# Patient Record
Sex: Male | Born: 1950 | Race: White | Hispanic: Yes | Smoking: Current every day smoker
Health system: Southern US, Community
[De-identification: ages and names within clinical notes are randomized; demographics above are authoritative.]

## PROBLEM LIST (undated history)

## (undated) DIAGNOSIS — K219 Gastro-esophageal reflux disease without esophagitis: Secondary | ICD-10-CM

---

## 2014-02-01 ENCOUNTER — Ambulatory Visit (HOSPITAL_BASED_OUTPATIENT_CLINIC_OR_DEPARTMENT_OTHER)
Admission: RE | Admit: 2014-02-01 | Discharge: 2014-02-01 | Disposition: A | Discharge status: Home / Self Care | Payer: Managed Care, Other (non HMO) | Source: Ambulatory Visit | Attending: Medical | Admitting: Medical

## 2014-02-01 ENCOUNTER — Ambulatory Visit (INDEPENDENT_AMBULATORY_CARE_PROVIDER_SITE_OTHER): Payer: Managed Care, Other (non HMO) | Admitting: Medical

## 2014-02-01 ENCOUNTER — Encounter: Payer: Self-pay | Admitting: Medical

## 2014-02-01 VITALS — BP 138/84 | HR 71 | Temp 98.9°F | Ht 66.75 in | Wt 185.2 lb

## 2014-02-01 DIAGNOSIS — N5089 Other specified disorders of the male genital organs: Secondary | ICD-10-CM | POA: Diagnosis present

## 2014-02-01 DIAGNOSIS — N433 Hydrocele, unspecified: Secondary | ICD-10-CM | POA: Insufficient documentation

## 2014-02-01 NOTE — Progress Notes (Signed)
Subjective:    Patient ID: Stephen Vincent, male    DOB: 1950-08-02, 63 y.o.   MRN: [id number]  HPI   Pt is a new pt. Pt states has not been to medical provider for 20 yrs or more. He had maybe basic brief physical exam before employment. Pt is from [place]. Pt new to the area for 8 months. Mom had bone cancer 76 yo deceased. Dad deceased accident. Sister- allergic rhinitis.  See pmh, psh, and fh.   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 Pt widow for 8 yrs. He has new girlfriend. 1 child 85 yrs old. Drinks 1-3 cups coffee a day. Occasional Coca-Cola. Pt walks occasionally. Pt does not have a strict diet. He eats twice a day.  Pt does state bulge in left inguinal canal for months. Gradually increased and now scrotum region. Does not hurt. Occasionally when he walks a lot gets swollen. Previously job he was lifting some but no exact cause that he remembers. Inguinal and scrotal bulge gradually enlarging over months. He estimates 2-3.  Pt does not have medicare. He has his private insurance. No   History of psa checked. No history of colonsocpy.     Review of Systems  Constitutional: Positive for fatigue. Negative for fever and chills.       In his new job he gets tired some. When walking.   HENT: Negative for congestion, ear discharge, ear pain, nosebleeds, postnasal drip, sore throat and tinnitus.   Eyes: Negative for photophobia, pain, discharge, redness, itching and visual disturbance.       Wears glasses. Last seen optometrist in September.  Respiratory: Negative for cough, choking, chest tightness, shortness of breath and wheezing.   Cardiovascular: Negative for chest pain and palpitations.  Gastrointestinal: Negative for nausea, vomiting, abdominal pain, diarrhea, constipation, blood in stool, abdominal distention, anal bleeding and rectal pain.  Endocrine: Negative for polydipsia, polyphagia and  polyuria.  Genitourinary: Negative for dysuria, urgency, frequency, hematuria, flank pain, discharge, penile swelling, scrotal swelling, enuresis, difficulty urinating, genital sores, penile pain and testicular pain.       Lt groin lump that has grown and not present in his scrotum  Musculoskeletal: Negative for arthralgias, back pain, gait problem, joint swelling, myalgias and neck stiffness.       Lt 2nd digit. Mild thick dip joint. But no pain.  Skin: Negative for color change, pallor, rash and wound.  Allergic/Immunologic: Negative for environmental allergies, food allergies and immunocompromised state.  Neurological: Negative for dizziness, tremors, seizures, syncope, facial asymmetry, speech difficulty, weakness, light-headedness, numbness and headaches.  Hematological: Negative for adenopathy. Does not bruise/bleed easily.  Psychiatric/Behavioral: Negative for behavioral problems, confusion and agitation.       When his wife passed 8 yrs ago. He had chronic low grade depression. He decided to work all the time to deal with depression. He had no family close. He has 2 cats and one dog. This helps. He still feels mild depression at times. Now that he has a girfriend he feels better not depressed.       Objective:   Physical Exam  Constitutional: He is oriented to person, place, and time. He appears well-developed and well-nourished. No distress.  HENT:  Head: Normocephalic and atraumatic.  Right Ear: External ear normal.  Left Ear: External ear normal.  Mouth/Throat: No oropharyngeal exudate.  Eyes: Conjunctivae are normal.  Neck: Neck supple. No JVD present. No  tracheal deviation present. No thyromegaly present.  Cardiovascular: Normal rate, regular rhythm and normal heart sounds.  Exam reveals no friction rub.   No murmur heard. Pulmonary/Chest: Effort normal and breath sounds normal. No stridor. No respiratory distress. He has no wheezes. He has no rales. He exhibits no tenderness.    Abdominal: Soft. Bowel sounds are normal. He exhibits no distension and no mass. There is no tenderness. There is no rebound and no guarding.  Genitourinary: Penis normal.  Patient has a large swollen region extending from the left lower inguinal canal all the way to the left side of his scrotum. This area feels full and tight like a hydrocele. I cannot determine if he has a hernia. On exam he does not have any pain.  Lymphadenopathy:    He has no cervical adenopathy.  Neurological: He is alert and oriented to person, place, and time. No cranial nerve deficit.  Skin: Skin is warm. No rash noted. He is not diaphoretic. No erythema. No pallor.     Otherwise normal. Large swollen area. Left inguinal region and on left side of scrotum.(Appointment prefers 11-12  Appointment.          Assessment & Plan:

## 2014-02-01 NOTE — Patient Instructions (Signed)
For your scrotal swelling and swollen area in inguinal region, I want to order a scrotal ultrasound to asses if your have hydrocele or hernia.(Possibly may have both.) I am going to refer you to urologist as well. Since you report some mild depression for years and you smoke, I want to offer wellbutrin. This medication may help your mood and may help you quit smoking. We will call you on your ultraound report. If you want to try wellbutrin let me know and will call this medication to your pharmacy. Also please schedule a appointment with me in 2-3 weeks for complete physical.   If you have lower abdomen pain or scotum pain pending urology appointment then be seen in ED.

## 2014-02-01 NOTE — Assessment & Plan Note (Signed)
This has occurred over the last 2-3 months or more. It is unclear if he has a hydrocele with possible hernia. I'm getting a ultrasound today to determine exactly etiology of the swelling. Since this is quite large I am going to go ahead and refer to urology. Patient was instructed that pending urology evaluation if he gets any acute pain or any associated signs and symptoms then he should be seen at the emergency department. Patient expresses understanding.

## 2014-02-03 ENCOUNTER — Telehealth: Payer: Self-pay | Admitting: Medical

## 2014-02-03 DIAGNOSIS — R1909 Other intra-abdominal and pelvic swelling, mass and lump: Secondary | ICD-10-CM

## 2014-02-03 NOTE — Telephone Encounter (Signed)
I did call pt today and left message on his machine stating that [place] was not conclusive and that radiologist could not give exact opinion on region. Explained radiologist recommend getting a CT. So I will place order and I explained to pt we will call him and advise him on the appointment date.

## 2014-02-05 ENCOUNTER — Telehealth: Payer: Self-pay | Admitting: Medical

## 2014-02-05 DIAGNOSIS — R1909 Other intra-abdominal and pelvic swelling, mass and lump: Secondary | ICD-10-CM

## 2014-02-05 NOTE — Telephone Encounter (Signed)
Stephen Vincent. Did I place order with contrast. Report just states Ct. Would you ask radiology do they want it done with or without. When I ordered I thought ordered without. I checked with contrast by accident if I ordered that.I will put in cmp if they think contrast needed. Please let me know.

## 2014-02-06 ENCOUNTER — Telehealth: Payer: Self-pay | Admitting: Medical

## 2014-02-06 NOTE — Telephone Encounter (Signed)
With and without per Radiology

## 2014-02-06 NOTE — Telephone Encounter (Signed)
Order ct and cmp done. Will notify  Anderson Malta.

## 2014-02-06 NOTE — Telephone Encounter (Signed)
Will place pelvic ct with and without contrast. And order cmp. (for left inguinal mass)

## 2014-02-08 ENCOUNTER — Telehealth: Payer: Self-pay | Admitting: Medical

## 2014-02-08 NOTE — Telephone Encounter (Signed)
Called to do peer to peer review for ct pelvis authorization. Called twice. All circuits busy. Will try later.

## 2014-02-12 ENCOUNTER — Telehealth: Payer: Self-pay | Admitting: Medical

## 2014-02-12 NOTE — Telephone Encounter (Signed)
I called radiologist who read his ultrasound. He will not be available until 3 pm. So I will call back later.

## 2014-02-12 NOTE — Telephone Encounter (Signed)
I did talk with Dr. Derrel Nip about the ultrasound. And he states that ct with oral contrast would be best to give definitive diagnosis.  So I will pass message for Anderson Malta to contact insurance. I have tried there number various times and was unable to get through. Per there sheet complication of hernia would be criteria that would allow me to order CT or if radiologist recommended. I talked with Dr. Derrel Nip on 02-12-2014 at 8:20.

## 2014-02-13 ENCOUNTER — Telehealth: Payer: Self-pay | Admitting: Medical

## 2014-02-13 DIAGNOSIS — Z87891 Personal history of nicotine dependence: Secondary | ICD-10-CM

## 2014-02-13 DIAGNOSIS — R1909 Other intra-abdominal and pelvic swelling, mass and lump: Secondary | ICD-10-CM

## 2014-02-13 NOTE — Telephone Encounter (Signed)
Caller name: Geovany  Relation to pt: self  Call back number: [phone]  Reason for call:   inquring about urology referral

## 2014-02-13 NOTE — Telephone Encounter (Signed)
Did you call insurance? Insurance needs a peer to peer review. [phone] hit option 4, then 2. Case ID is [id number]

## 2014-02-13 NOTE — Telephone Encounter (Signed)
I have called them numerous times. Never able to get through. Would you get them on the phone and I get me. Then I will talk with insurance personal. Explain to them I had conversation with radiologist.

## 2014-02-13 NOTE — Telephone Encounter (Signed)
Would you call insurance for me. I have failed to get them on line many times. Then I  Will talk with them. Not sure why I can't get thorugh.

## 2014-02-13 NOTE — Telephone Encounter (Signed)
I do not see referral to urology, please advise.

## 2014-02-14 NOTE — Telephone Encounter (Signed)
We tried to get peer on th phone and it took about 30 minutes. Then when tried to talk with peer/MD line cut off. Then we called back and they stated 30 minute wait. So at this point decided would make an expedited referral to urologist since the area in question when I examined seemed to extend almost down into the scrotum. Note it has been a week or more trying to get Ct pelvis done.

## 2014-02-22 ENCOUNTER — Encounter: Payer: 59 | Admitting: Medical

## 2014-03-07 ENCOUNTER — Encounter: Payer: Self-pay | Admitting: Medical

## 2014-03-07 ENCOUNTER — Ambulatory Visit (INDEPENDENT_AMBULATORY_CARE_PROVIDER_SITE_OTHER): Payer: Managed Care, Other (non HMO) | Admitting: Medical

## 2014-03-07 VITALS — BP 130/80 | HR 80 | Temp 98.9°F | Ht 65.5 in | Wt 183.2 lb

## 2014-03-07 DIAGNOSIS — Z0000 Encounter for general adult medical examination without abnormal findings: Secondary | ICD-10-CM | POA: Insufficient documentation

## 2014-03-07 DIAGNOSIS — Z0189 Encounter for other specified special examinations: Secondary | ICD-10-CM

## 2014-03-07 DIAGNOSIS — Z125 Encounter for screening for malignant neoplasm of prostate: Secondary | ICD-10-CM

## 2014-03-07 DIAGNOSIS — Z720 Tobacco use: Secondary | ICD-10-CM

## 2014-03-07 DIAGNOSIS — F17200 Nicotine dependence, unspecified, uncomplicated: Secondary | ICD-10-CM

## 2014-03-07 DIAGNOSIS — Z136 Encounter for screening for cardiovascular disorders: Secondary | ICD-10-CM

## 2014-03-07 DIAGNOSIS — Z23 Encounter for immunization: Secondary | ICD-10-CM

## 2014-03-07 DIAGNOSIS — Z1211 Encounter for screening for malignant neoplasm of colon: Secondary | ICD-10-CM

## 2014-03-07 LAB — POCT URINALYSIS DIPSTICK
Bilirubin, UA: NEGATIVE
GLUCOSE UA: NEGATIVE
Ketones, UA: NEGATIVE
Leukocytes, UA: NEGATIVE
Nitrite, UA: NEGATIVE
SPEC GRAV UA: 1.02
UROBILINOGEN UA: 0.2
pH, UA: 5

## 2014-03-07 LAB — PSA: PSA: 1.04 ng/mL (ref 0.10–4.00)

## 2014-03-07 LAB — CBC WITH DIFFERENTIAL/PLATELET
Basophils Absolute: 0.1 10*3/uL (ref 0.0–0.1)
Basophils Relative: 0.8 % (ref 0.0–3.0)
EOS PCT: 12.2 % — AB (ref 0.0–5.0)
Eosinophils Absolute: 1.1 10*3/uL — ABNORMAL HIGH (ref 0.0–0.7)
HEMATOCRIT: 49.5 % (ref 39.0–52.0)
Hemoglobin: 16.2 g/dL (ref 13.0–17.0)
LYMPHS ABS: 2.5 10*3/uL (ref 0.7–4.0)
LYMPHS PCT: 26.6 % (ref 12.0–46.0)
MCHC: 32.8 g/dL (ref 30.0–36.0)
MCV: 88.6 fl (ref 78.0–100.0)
Monocytes Absolute: 0.6 10*3/uL (ref 0.1–1.0)
Monocytes Relative: 6.3 % (ref 3.0–12.0)
Neutro Abs: 5 10*3/uL (ref 1.4–7.7)
Neutrophils Relative %: 54.1 % (ref 43.0–77.0)
PLATELETS: 257 10*3/uL (ref 150.0–400.0)
RBC: 5.58 Mil/uL (ref 4.22–5.81)
RDW: 13.2 % (ref 11.5–15.5)
WBC: 9.2 10*3/uL (ref 4.0–10.5)

## 2014-03-07 LAB — TSH: TSH: 1.15 u[IU]/mL (ref 0.35–4.50)

## 2014-03-07 NOTE — Progress Notes (Signed)
Pre visit review using our clinic review tool, if applicable. No additional management support is needed unless otherwise documented below in the visit note. 

## 2014-03-07 NOTE — Patient Instructions (Signed)
Cuidados preventivos en los adultos (Preventive Care for Adults) Un estilo de vida saludable y los cuidados preventivos pueden favorecer la salud y el bienestar. Las pautas de salud preventivas para los varones incluyen las siguientes prcticas clave:  Un examen fsico de rutina anual y realizar estudios preventivos es un buen modo de controlar su salud. Le da la posibilidad de compartir preocupaciones y conocer el estado de su salud, y que le realicen estudios completos.  Consulte al dentista para realizar un examen de rutina y cuidados preventivos cada 6 meses. Cepllese los dientes al menos dos veces por da y psese el hilo dental al menos una vez por da. Una buena higiene bucal evita caries y enfermedades de las encas.  La frecuencia con que debe hacerse exmenes de la vista depende de su edad, su estado de salud, su historia familiar, el uso de lentes de contacto y otros factores. Siga las recomendaciones del mdico para saber con qu frecuencia debe hacerse exmenes de la vista.  Consuma una dieta saludable. Los alimentos como vegetales, frutas, cereales integrales, productos lcteos descremados y protenas magras contienen los nutrientes que usted necesita sin necesidad de consumir muchas caloras. Disminuya la ingesta de alimentos ricos en grasas slidas, azcares y sal agregadas. Consuma la cantidad de caloras adecuada para usted. Si es necesario, pdale informacin acerca de una dieta adecuada a su mdico.  Realizar actividad fsica de forma regular es una de las prcticas ms importantes que puede hacer por su salud. Los adultos deben hacer al menos 150 minutos de ejercicios de intensidad moderada (cualquier actividad que aumente la frecuencia cardaca y lo haga transpirar) cada semana. Adems, la mayora de los adultos necesita practicar ejercicios de fortalecimiento muscular dos o ms veces por semana.  Mantenga un peso saludable. El ndice de masa corporal (IMC) es una herramienta que  identifica posibles problemas con el peso. Proporciona una estimacin de la grasa corporal basndose en el peso y la altura. El mdico podr determinar su IMC y ayudarlo a lograr o mantener un peso saludable. Para los adultos de 20 aos o ms:  Un IMC menor de 18,5 se considera bajo peso.  Un IMC entre 18,5 y 24,9 es normal.  Un IMC entre 25 y 29,9 se considera sobrepeso.  Un IMC de 30 o ms se considera obesidad.  Realice actividad fsica y evite ingerir grasas saturadas para mantener un nivel normal de lpidos y colesterol en la sangre. Consuma una dieta equilibrada y saludable, e incluya variedad de frutas y vegetales. A partir de los 20 aos se deben realizar anlisis de sangre a fin de controlar el nivel de lpidos y colesterol en la sangre y repetirlos cada 5 aos. Si los niveles de lpidos o colesterol son altos, tiene ms de 50 aos o tiene riesgo elevado de sufrir enfermedades cardacas, necesitar controlarse con ms frecuencia. Si tiene niveles elevados de lpidos y colesterol, debe recibir tratamiento con medicamentos, si la dieta y el ejercicio no estn funcionando.  Si fuma, consulte con el mdico acerca de las opciones para dejar de hacerlo. Si no fuma, no comience.  Se recomienda realizar exmenes de deteccin de cncer de pulmn a personas adultas entre 55 y 80 aos que estn en riesgo de desarrollar cncer de pulmn por sus antecedentes de consumo de tabaco. Para quienes hayan fumado durante 30 aos un paquete diario, y sigan fumando o hayan dejado el hbito en algn momento en los ltimos 15 aos, se recomienda realizarse una tomografa computarizada de baja dosis   de los [employer]. Fumar durante un ao-paquete equivale a fumar un promedio de un paquete de cigarrillos diario durante un ao (por ejemplo: un paquete por da durante [place] paquetes por da durante 15 aos). Los exmenes anuales deben continuar hasta que el fumador haya dejado de fumar durante un  mnimo de 15 aos. Ya no deben Emergency planning/management officer que tengan un problema de salud que les impida recibir tratamiento para el cncer de pulmn.  Si decide tomar alcohol, no beba ms de [employer]. Se considera una medida a 12onzas (318m) de cerveza, 5onzas (1443m de vino, o 1,[id number] licor.  Evite el consumo de drogas. No comparta agujas. Pida ayuda si necesita asistencia o instrucciones con respecto a abandonar el consumo de drogas.  La hipertensin arterial causa enfermedades cardacas y auSerbial riesgo de ictus. Debe controlar su presin arterial al menos cada uno o doSuttonLa hipertensin arterial que persiste debe tratarse con medicamentos si la prdida de peso y el ejercicio no son efectivos.  Si tiene entre [id number], consulte a su mdico si debe tomar aspirina para prevenir enfermedades cardacas.  Los anlisis para la diabetes incluyen la toma de unTanzaniae sangre para controlar el nivel de azcar en la sangre durante el ayLongviewDebe hacerlo cada 3 aos despus de los [id number] si est dentro de su peso normal y sin factores de riesgo para la diabetes. Las pruebas deben comenzar a edades tempranas o llevarse a cabo con ms frecuencia si tiene sobrepeso y al menos un factor de riesgo para la diabetes.  El cncer colorrectal puede detectarse y con frecuencia puede prevenirse. La mayor parte de los estudios de rutina se deben coMedical laboratory scientific officer haField seismologist paProofreadere los [id number] y [id number]. Sin embargo, el mdico podr aconsejarle que lo haga antes, si tiene factores de riesgo para el cncer de colon. Una vez por ao, el mdico le dar un kit de prueba para haHydrologistn la materia fecal. La utilizacin de un tubo con una pequea cmara en su extremo para examinar directamente el colon (sigmoidoscopa o colonoscopa), puede detectar formas tempranas de cncer colorrectal. Hable con su mdico si tiene [id number], edad en la que debe comenzar a reOptometristos  estudios de ruNepalEl examen directo del colon debe repetirse cada 5 a 10 aos, hasta los 75 aos, excepto que se encuentren formas tempranas de plipos precancerosos o pequeos bultos.  Las personas con un riesgo mayor de paInsurance risk surveyorepatitis B deben realizarse anlisis para deFutures traderirus. Se considera que tiene un alto riesgo de coMuseum/gallery curatorepatitis B si:  Naci en un pas donde la hepatitis B es frecuente. Pregntele a su mdico qu pases son considerados de alPublic affairs consultant Sus padres nacieron en un pas de alto riesgo y usted no recibi una vacuna que lo proteja contra la hepatitis B (vacuna contra la hepatitis B).  TiAlma Center UsCanadagujas para inyectarse drogas.  Vive o tiene sexo con alguien que tiene hepatitis B.  Es un hombre que tiene sexo con otros hombres.  Recibe tratamiento de hemodilisis.  Toma ciertos medicamentos para enNurse, mental healthtrasplante de rganos y afecciones autoinmunitarias.  Se recomienda realizar un anlisis de sangre para deHydrographic surveyorepatitis C a todas las personas nacidas entre 1945 y 1965, y a todo aquel que sepa que tiene riesgo de haber contrado esta enfermedad.  Practique  el sexo seguro. Use condones y evite las prcticas sexuales riesgosas para disminuir el contagio de enfermedades de transmisin sexual (ETS). Algunas ETS son la gonorrea, clamidia, sfilis, tricomoniasis, herpes, VPH y el virus de inmunodeficiencia humana (VIH). El herpes, el VIH y el VPH son enfermedades virales que no tienen cura. Pueden producir discapacidad, cncer y hasta la muerte.  Si tiene riesgo de infectarse por el VIH, se recomienda tomar diariamente un medicamento recetado para evitar la infeccin. Esto se conoce como profilaxis previa a la exposicin. Se considera que est en riesgo si:  Es un hombre que tiene sexo con otros hombres y tiene otros factores de riesgo.  Es un hombre heterosexual, es sexualmente activo y tiene ms riesgo de contraer una infeccin  por VIH.  Se inyecta drogas.  Es sexualmente activo con una pareja que tiene VIH.  Consulte a su mdico para saber si tiene un alto riesgo de infectarse por el VIH. Si opta por comenzar la profilaxis previa a la exposicin, primero debe realizarse anlisis de deteccin del VIH. Luego, le harn anlisis cada 3meses mientras est tomando los medicamentos para la profilaxis previa a la exposicin.  Se recomienda que las personas entre 65 y 75 aos que sean fumadores, o que lo haya sido, se realicen un estudio con ecografa para detectar aneurisma de aorta abdominal (AAA) y su eventual reparacin quirrgica.  Los hombres sanos no deben hacerse anlisis de sangre para detectar antgenos especficos prostticos (PSA) como parte de los estudios de rutina para el cncer. Pregntele a su mdico sobre las pruebas de deteccin de cncer de prstata.  La evaluacin del cncer de testculos no se recomienda en varones adultos que no tengan sntomas. La evaluacin incluye el autoexamen, el examen por parte del mdico y otras pruebas diagnsticas. Consulte con su mdico si tiene algn sntoma o preocupaciones acerca del cncer de testculos.  Utilice pantalla solar. Aplique pantalla solar de manera libre y repetida a lo largo del da. Resgurdese del sol cuando la sombra sea ms pequea que usted. Protjase usando mangas y pantalones largos, un sombrero de ala ancha y anteojos para el sol todo el ao, siempre que se encuentre al aire libre.  Una vez por mes, examnese la piel de todo el cuerpo y use un espejo para ver la espalda. Informe al mdico si aparecen nuevos lunares, o si nota que los que ya tena ahora tienen bordes irregulares, aumentaron su tamao y son ms grande que una goma de lpiz o si su forma o color cambi.  Mantngase al da con las vacunas obligatorias .  Vacuna antigripal. Todas las personas adultas deben vacunarse cada ao.  Vacuna contra la difteria, ttanos y pertusis acelular (DT,  DTPa). Una persona adulta que no haya recibido la vacuna Tdap anteriormente o que no sabe su estado de vacunacin debe recibir una dosis. Despus de esta dosis inicial, necesitar aplicarse un refuerzo de la vacuna contra la difteria y el ttanos (DT) cada 10 aos. Las personas adultas que no sepan o no hayan recibido la serie de vacunacin de tres dosis contra la difteria y el ttanos deben iniciar o finalizar una serie de vacunacin primaria, que incluye la dosis contra la difteria, el ttanos y la tosferina acelular (DTPa). Las personas adultas deben recibir una dosis de refuerzo de DT cada 10 aos.  Vacuna contra la varicela. Una persona adulta sin prueba de inmunidad a la varicela debe recibir dos dosis o una segunda dosis si recibi una dosis previamente.  Vacuna   contra el virus del papiloma humano (VPH). Los varones entre 13 y 21 aos que no hayan recibido la vacuna deben recibir la serie de tres dosis. Los varones entre 22 y 26 aos pueden recibir la vacuna. Se recomienda la vacuna hasta la edad de 26 aos para todo hombre que mantenga relaciones sexuales con otros hombres y no haya recibido alguna o ninguna de las dosis anteriormente. Se recomienda la vacuna para cualquier persona inmunodeprimida hasta la edad de 26 aos si no recibi alguna o ninguna de las dosis anteriormente. Durante la serie de 3 dosis, la segunda dosis debe aplicarse entre las 4 y 8 semanas posteriores a la primera dosis. La tercera dosis debe aplicarse 24 semanas despus de la primera dosis y 16 semanas despus de la segunda dosis.  Vacuna contra el herpes zoster. Se recomienda una dosis en personas adultas mayores de 60 aos a menos que sufran ciertas enfermedades.  Vacuna contra el sarampin, la rubola y las paperas (SRP) Los adultos nacidos antes de 1957 generalmente se consideran inmunes al sarampin y las paperas. Las personas nacidas en 1957 o posteriormente deben recibir una o ms dosis de la vacuna SRP, a menos que  exista una contraindicacin para la vacuna o que tengan prueba de inmunidad a las tres enfermedades. Se debe aplicar una segunda dosis de rutina de la vacuna SRP al menos 28das despus de la primera dosis a estudiantes de escuelas terciarias, trabajadores de la salud o viajeros internacionales. Las personas que recibieron la vacuna inactivada contra el sarampin o algn tipo desconocido de vacuna contra el sarampin entre los aos 1963 y 1967 deben recibir dos dosis de la vacuna SRP. Las personas que recibieron la vacuna inactivada contra las paperas o algn tipo desconocido de vacuna contra las paperas antes de 1979 y tienen un alto riesgo de infectarse con la enfermedad deben considerar vacunarse con dos dosis de la vacuna SRP. Los trabajadores de la salud no vacunados que nacieron antes de 1957 y que no tienen prueba de inmunidad contra el sarampin, la rubola y las paperas o no tienen confirmacin de laboratorio de la enfermedad deben considerar vacunarse contra el sarampin y las paperas con dos dosis de la vacuna SRP, y contra la rubola con una dosis de la vacuna SRP.  Vacuna antineumoccica conjugada 13 valente (PCV13). Segn indicacin mdica, una persona que no conozca su historia de vacunacin y no tenga registro de vacunas debe recibir la vacuna PCV13. Una persona de 19 aos o ms que tenga ciertas enfermedades y no se haya vacunado debe recibir una dosis de la vacuna PCV13. Despus de esta vacuna, se debe aplicar una dosis de la vacuna antineumoccica de polisacridos (PPSV23). La dosis de la vacuna PPSV23 se debe recibir al menos ocho semanas despus de la dosis de la vacuna PCV13. Una persona de 19 aos o ms, que tenga ciertas enfermedades y haya recibido una o ms dosis de la vacuna PPSV23 previamente debe recibir una dosis de la vacuna PCV13. La dosis de la vacuna PCV13 se debe aplicar uno o ms aos despus de la dosis de la vacuna PPSV23.  Vacuna antineumoccica de polisacridos (PPSV23).  Si se indica la vacuna PCV13, primero debe recibir la vacuna PCV13. Todas las personas de 65 aos o mayores deben recibir la vacuna. Una persona menor de 65 aos que tenga ciertas enfermedades se debe vacunar. Toda persona que viva en un hogar de ancianos o en un centro de atencin durante mucho tiempo se debe vacunar. Un   fumador adulto se debe vacunar. Las personas inmunodeprimidas o con otras enfermedades deben recibir ambas vacunas, PCV13 y PPSV23. Las personas infectadas con el virus de la inmunodeficiencia humana (VIH) deben recibir la vacuna lo antes posible despus del diagnstico. Se debe evitar la vacunacin durante tratamientos de quimioterapia y radioterapia. El uso de rutina de la vacuna PPSV23 no est recomendado para indios americanos, personas nativas de [place] o menores de 65aos, salvo que tengan ciertas enfermedades que requieran la vacuna. Segn indicacin mdica, las personas que no conozcan su historia de vacunacin y no tengan registros de vacunas, deben recibir la vacuna PPSV23. Se recomienda una nica revacunacin 5 aos despus de recibir la primera dosis de PPSV23 para personas de 19 a 64 aos con insuficiencia renal crnica, sndrome nefrtico, asplenia o inmunodepresin. Las personas que recibieron de una a dos dosis de PPSV23 antes de los 65 aos deben recibir otra dosis de dicha vacuna a los 65 aos de edad o posteriormente si pasaron cinco aos, como mnimo, desde la dosis anterior. Las dosis de PPSV23 no son necesarias para personas que ya recibieron la vacuna a los 65 aos o posteriormente.  Vacuna antimeningoccica. Los adultos con asplenia o con persistentes deficiencias de componentes terminales del complemento deben recibir dos dosis de la vacuna antimeningoccica conjugada tetravalente (MenACWY-D). Las dosis se deben aplicar con un mnimo de 2 meses de diferencia. Deben vacunarse los microbilogos que trabajan con ciertas bacterias meningoccicas, reclutas militares y  personas que viajan o viven en pases con una alta tasa de meningitis. Los estudiantes universitarios de primer ao hasta la edad de 21 que vivan en una residencia estudiantil deben recibir una dosis si no se aplicaron la vacuna cuando cumplieron 16 aos o posteriormente. Las personas que sufren ciertas enfermedades de alto riesgo deben aplicarse una o ms dosis.  Vacuna contra la hepatitis A. Las personas que deseen estar protegidas contra esta enfermedad, que sufren ciertas enfermedades de alto riesgo, que trabajan con animales infectados con hepatitis A, que trabajan en los laboratorios de investigacin de hepatitis A, o que viajan o trabajan en pases con una alta tasa de hepatitis A deben recibir la vacuna. Los personas que no fueron vacunadas previamente y que van a tener un contacto cercano con una persona adoptada fuera del pas, deben recibir la vacuna durante los primeros 60 das despus de su llegada a los [place] desde un pas con una alta tasa de hepatitis A.  Vacuna contra la hepatitis B. Las personas adultas deben recibir la vacuna si desean estar protegidas contra esta enfermedad, sufren ciertas enfermedades de alto riesgo, pueden estar expuestas a sangre u otros fluidos corporales infecciosos, tienen familiares o parejas sexuales con hepatitisB positivo, son clientes o trabajadores de ciertos centros de atencin, o viajan o trabajan en pases con una alta tasa de hepatitisB.  Vacuna antihaemophilus influenzae tipo B (Hib). Una persona no vacunada previamente, que sufra de asplenia o de anemia drepanoctica, o que tenga una esplenectoma programada debe recibir una dosis de la vacuna Hib. Independientemente de la vacunacin previa, un paciente trasplantado con clulas madre hematopoyticas debe recibir una serie de tres dosis, de 6 a 12 meses despus del trasplante exitoso. La vacuna Hib no est recomendada para personas adultas infectadas con VIH. Controles preventivos -  Frecuencia Entre 19 y 39 aos  Control de la presin arterial.**/Cada uno a dos aos.  Control de lpidos y colesterol.**/Cada cinco aos a partir de los 20 aos.  Anlisis de sangre para la hepatitis C.**/Para   toda persona con riesgos conocidos de hepatitis C.  Autoexamen de piel /todos los meses  [place] antigripal. [place] los aos  Vacuna contra la difteria, ttanos y Advice worker (DTPa, DT).**/Consulte a su mdico. Una dosis de DT cada 10 aos.  Vacuna contra la varicela.Marland KitchenCecille Amsterdam a su mdico.  Alexzandrea Normington Jolly contra el virus del Engineer, technical sales (VPH). [employer] dosis en el curso de seis meses, si tiene 16 aos o menos.  Vacuna contra el sarampin, la rubola y las paperas ([place]).Marland KitchenEarleen Newport aplicarse al menos una dosis de SRP si ha nacido en 1957 o despus. Podra tambin necesitar una segunda dosis.  Vacuna antineumoccica conjugada 13 valente (PCV13).Marland KitchenCecille Amsterdam a su mdico.  Vacuna antineumoccica de polisacridos (PPSV23).**/De una a dos dosis si es fumador o si sufre Actuary.  Vacuna antimeningoccica.Marland KitchenArdelia Mems dosis si tiene entre 84 y 51 aos y es estudiante universitario de Psychologist, educational ao que vive en una residencia estudiantil o tiene alguna enfermedad grave. Podra tambin necesitar dosis de refuerzo.  Vacuna contra la hepatitis A.**/Consulte a su mdico.  Vacuna contra la hepatitis B.**/Consulte a su mdico.  Vacuna antihaemophilus influenzae tipo B (Hib).**/Consulte a su mdico. Entre los 40 y 56 aos  Control de la presin arterial.**/Cada uno a [employer].  Control de lpidos y colesterol.**Carma Lair cinco aos a partir de los 20 aos.  Pruebas de deteccin de cncer de pulmn. /Todos los aos si tiene entre 79 y 75 aos, y si ha fumado durante 2 aos un paquete diario y sigue fumando o dej el hbito en algn momento en los ltimos 15 aos. Los estudios de Pharmacologist se interrumpen cuando haya dejado de fumar durante al menos 15aos o si tiene un problema de  salud que le impida recibir tratamiento para Science writer de pulmn.  Prueba de sangre oculta en materia fecal. Carma Lair ao a partir de los 54 aos de edad y [place] 52. No tendr que hacerlo si se realiza una colonoscopa cada 10 aos.  Colonoscopa** o sigmoidoscopa flexible.Marland KitchenCarma Lair 5 aos para la sigmoidoscopa flexible o cada 10 aos para la colonoscopa, a Proofreader de los 66 aos de edad y Whitney 25 aos.  Anlisis de sangre para la hepatitis C.**/Para todas las personas nacidas entre 1945 y 1965, y a todo aquel que tenga un riesgo conocido de haber contrado esta enfermedad.  Autoexamen de piel /todos los meses  [place] antigripal. [place] los aos  Vacuna contra la difteria, ttanos y Advice worker (DTPa/DT).**/Consulte a su mdico. Una dosis de DT cada 10 aos.  Vacuna contra la varicela.**Cecille Amsterdam a su mdico.  Vacuna contra el herpes zoster.Marland KitchenArdelia Mems dosis para adultos de 60 aos o ms.  Vacuna contra el sarampin, la rubola y las paperas ([place]).Marland KitchenEarleen Newport aplicarse al menos una dosis de SRP si ha nacido en 1957 o despus. Podra tambin necesitar una segunda dosis.  Vacuna antineumoccica conjugada 13 valente (PCV13).Marland KitchenCecille Amsterdam a su mdico.  Vacuna antineumoccica de polisacridos (PPSV23).**/De una a dos dosis si es fumador o si sufre Actuary.  Vacuna antimeningoccica.Marland KitchenCecille Amsterdam a su mdico.  Investment banker, operational contra la hepatitis A.**/Consulte a su mdico.  Vacuna contra la hepatitis B.**/Consulte a su mdico.  Vacuna antihaemophilus influenzae tipo B (Hib).**/Consulte a su mdico. 65 aos o ms  Control de la presin arterial.**/Cada uno a [employer].  Control de lpidos y colesterol.**Carma Lair cinco aos a partir de los 20 aos.  Pruebas de deteccin de cncer de pulmn. /Todos los aos si tiene entre 18 y 110 aos, y si ha fumado durante 74 aos un paquete  diario y sigue fumando o dej el hbito en algn momento en los ltimos 15 aos. Los estudios de deteccin anuales  se interrumpen cuando haya dejado de fumar durante al menos 15aos o si tiene un problema de salud que le impida recibir tratamiento para el cncer de pulmn.  Prueba de sangre oculta en materia fecal. /Cada ao a partir de los 50 aos de edad y hasta los 75. No tendr que hacerlo si se realiza una colonoscopa cada 10 aos.  Colonoscopa** o sigmoidoscopa flexible.**/Cada 5 aos para la sigmoidoscopa flexible o cada 10 aos para la colonoscopa, a partir de los 50 aos de edad y hasta los 75 aos.  Anlisis de sangre para la hepatitis C.**/Para todas las personas nacidas entre 1945 y 1965, y a todo aquel que tenga un riesgo conocido de haber contrado esta enfermedad.  Pruebas de deteccin de aneurisma de aorta abdominal (AAA).**/Una nica prueba para personas entre 65 y 75 aos que sean o hayan sido fumadoras.  Autoexamen de piel /todos los meses  Vacuna antigripal. /Todos los aos  Vacuna contra la difteria, ttanos y pertusis acelular (DTPa/DT).**/Una dosis de DT cada 10 aos.  Vacuna contra la varicela.**/Consulte a su mdico.  Vacuna contra el herpes zoster.**/Una dosis para adultos de 60 aos o ms.  Vacuna antineumoccica conjugada 13 valente (PCV13).**/Consulte a su mdico.  Vacuna antineumoccica de polisacridos (PPSV23).**/Una dosis para todos los adultos de 65 aos o ms.  Vacuna antimeningoccica.**/Consulte a su mdico.  Vacuna contra la hepatitis A.**/Consulte a su mdico.  Vacuna contra la hepatitis B.**/Consulte a su mdico.  Vacuna antihaemophilus influenzae tipo B (Hib).**/Consulte a su mdico. **Los antecedentes familiares y personales de riesgos y enfermedades pueden cambiar las recomendaciones del mdico. Document Released: 02/11/2005 Document Revised: 05/09/2013 ExitCare Patient Information 2015 [employer]. This information is not intended to replace advice given to you by your health care provider. Make sure you discuss any questions you have with  your health care provider.  I want you to get labs today. We gave pneumonia 13 vaccine today. I want to reconsider fluvaccine and zostavax. I am referring you to [facility] for colonoscopy. Also I will be placing orders to get ultrasound of your abdominal aorta and ct screening of your chest due to smoking history.(Explained in spanish)   

## 2014-03-07 NOTE — Assessment & Plan Note (Signed)
Labs today. Cbc, tsh, lipid, cmp and psa. UA done as well.

## 2014-03-07 NOTE — Progress Notes (Signed)
Subjective:    Patient ID: Stephen Vincent, male    DOB: May 31, 1950, 63 y.o.   MRN: [id number]  HPI  Pt in for annual physical. He is 63 yo but has private inurance through his work.  Pt never had colonoscopy, No prostate check. Pt states more than 10 yrs since last tetanus. Pt does not want flu vaccine. Pt is willing to get pneumonia vaccine. Pt declines zostavax.  Pt not exercising. Pt eats some fruits and vegetables. Overall healthy diet. Pt does smoke.No alcohol use. No falls. Pt denies any recent depression.  Pt does have upcoming appointment with urology scheduled for his left inguinal mass.  No past medical history on file.  History   Social History  . Marital Status: Widowed    Spouse Name: N/A    Number of Children: N/A  . Years of Education: N/A   Occupational History  . Not on file.   Social History Main Topics  . Smoking status: Current Every Day Smoker -- 6.00 packs/day    Types: Cigarettes  . Smokeless tobacco: Never Used  . Alcohol Use: Yes  . Drug Use: No  . Sexual Activity: Yes   Other Topics Concern  . Not on file   Social History Narrative  . No narrative on file    No past surgical history on file.  Family History  Problem Relation Age of Onset  . Cancer Mother     No Known Allergies  Current Outpatient Prescriptions on File Prior to Visit  Medication Sig Dispense Refill  . Multiple Vitamins-Minerals (MULTIVITAMIN & MINERAL PO) Take by mouth.       No current facility-administered medications on file prior to visit.    BP 130/80  Pulse 80  Temp(Src) 98.9 F (37.2 C) (Oral)  Ht 5' 5.5" (1.664 m)  Wt 183 lb 3.2 oz (83.099 kg)  BMI 30.01 kg/m2  SpO2 97%       Review of Systems  Constitutional: Negative for fever, chills and fatigue.  HENT: Negative.   Respiratory: Negative for cough, choking, chest tightness, shortness of breath and wheezing.   Cardiovascular: Negative for chest pain and palpitations.  Gastrointestinal:  Negative.   Genitourinary: Negative.   Musculoskeletal: Negative.   Neurological: Negative for dizziness, tremors, seizures, syncope, facial asymmetry, speech difficulty, weakness, light-headedness, numbness and headaches.  Hematological: Negative for adenopathy. Does not bruise/bleed easily.  Psychiatric/Behavioral: Negative.        Mood is better now that has girlfriend.       Objective:   Physical Exam  General Mental Status- Alert. Orientation- Oriented x3.  Build and Nutrition- Well nourished and Well Developed.  Skin General:-Normal. Color- Normal color. Moisture- Normal. Temperature-Warm.  HEENT  Ears- Normal. Auditory Canal- Bilateral-Normal. Tympanic Membrane- Bilateral-Normal. Eye Fundi-Bilateral-Normal. Pupil- bilateral- Direct reaction to light normal. Nose & Sinuses- Normal. Nostrils-Bilateral- Normal. Mouth & Throat-Normal.  Neck Neck- No Bruits or Masses. Trachea midline.  Thyroid- Normal.  Chest and Lung Exam Percussion: Quality and Intensity-Percussion normal. Percussion of the chest reveals- No Dullness.  Palpation: Palpation of the chest reveals- Non-tender- No dullness. Auscultation: Breath Sounds- Normal.  Adventitous Sounds:-No adventitious sounds.  Cardiovascular Inspection:- No Heaves. Auscultation:-Normal sinus rhythm without murmur gallop, S1 WNL and S2 WNL.  Abdomen Inspection:-Inspection Normal. Inspection of the abdomen reveals- No hernias Palpation/Percussion:- Palpation and Percussion of the Abdomen reveal- Non Tender and No Palpable abdominal masses. Liver: Other Characteristics- No hepatomegaly. Spleen:Other Characteristics- No Splenomegaly. Auscultation:- Auscultation of the abdomen reveals- Bowel sounds normal  and No Abdominal bruits.  Male Genitourinary Urethra:- No discharge. Penis- Circumcised. Scrotum- No masses. Testes- Bilateral-Normal. Pt still has lt inguinal mass that comes down into scrotum.  Rectal Anorectal Exam:  Performed- Normal sphincter tone. No masses noted. Prostate smooth but mild enlarged size. Stool HEME Negative.  Peripheral  Vascular Lower Extremity:Inspection- Bilateral-Inspection Normal  Palpation: Femoral pulse- Bilateral- 2+. Popliteal pulse- Bilateral-2+. Dorsalis pedis pulse- Bilateral- 1/2+. Edema- Bilateral- No edema.  Neurologic Mental Status:- Normal. Cranial Nerves:-Normal Bilaterally. Motor:-Normal. Strength:5/5 normal muscle strength-All Muscles. General Assessment of Reflexes: Right Knee-2+. Left Knee- 2+. Coordination-Normal. Gait- Normal.  Meningeal Signs- None.  Musculoskeletal Global Assessment General-Joints show full range of motion without obvious deformity and Normal muscle mass. Strength in upper and lower extremities.  Back- some kyphosis.  Lymphatics General lymphatics Description- No generalized lymphadenopathy.        Assessment & Plan:

## 2014-03-08 ENCOUNTER — Telehealth: Payer: Self-pay | Admitting: Medical

## 2014-03-08 ENCOUNTER — Other Ambulatory Visit: Payer: Self-pay | Admitting: Medical

## 2014-03-08 LAB — COMPREHENSIVE METABOLIC PANEL
ALBUMIN: 3.8 g/dL (ref 3.5–5.2)
ALT: 17 U/L (ref 0–53)
AST: 21 U/L (ref 0–37)
Alkaline Phosphatase: 101 U/L (ref 39–117)
BUN: 15 mg/dL (ref 6–23)
CALCIUM: 9.5 mg/dL (ref 8.4–10.5)
CO2: 23 meq/L (ref 19–32)
Chloride: 108 mEq/L (ref 96–112)
Creatinine, Ser: 1.2 mg/dL (ref 0.4–1.5)
GFR: 66.44 mL/min (ref >60.00)
Glucose, Bld: 110 mg/dL — ABNORMAL HIGH (ref 70–99)
Potassium: 4.3 mEq/L (ref 3.5–5.1)
SODIUM: 144 meq/L (ref 135–145)
TOTAL PROTEIN: 7.5 g/dL (ref 6.0–8.3)
Total Bilirubin: 0.8 mg/dL (ref 0.2–1.2)

## 2014-03-08 LAB — LIPID PANEL
Cholesterol: 148 mg/dL (ref 0–200)
HDL: 45.1 mg/dL (ref >39.00)
LDL Cholesterol: 83 mg/dL (ref 0–99)
NonHDL: 102.9
Total CHOL/HDL Ratio: 3
Triglycerides: 102 mg/dL (ref 0.0–149.0)
VLDL: 20.4 mg/dL (ref 0.0–40.0)

## 2014-03-08 NOTE — Telephone Encounter (Signed)
emmi emailed °

## 2014-03-09 ENCOUNTER — Other Ambulatory Visit: Payer: Self-pay | Admitting: Medical

## 2014-03-09 DIAGNOSIS — R739 Hyperglycemia, unspecified: Secondary | ICD-10-CM

## 2014-03-12 NOTE — Telephone Encounter (Signed)
Pt is returning call to Santiago Glad.  Please call at [phone].

## 2014-03-15 ENCOUNTER — Telehealth: Payer: Self-pay | Admitting: Medical

## 2014-03-15 ENCOUNTER — Ambulatory Visit (HOSPITAL_BASED_OUTPATIENT_CLINIC_OR_DEPARTMENT_OTHER)
Admission: RE | Admit: 2014-03-15 | Discharge: 2014-03-15 | Disposition: A | Discharge status: Home / Self Care | Payer: Managed Care, Other (non HMO) | Source: Ambulatory Visit | Attending: Diagnostic Radiology | Admitting: Diagnostic Radiology

## 2014-03-15 DIAGNOSIS — F17200 Nicotine dependence, unspecified, uncomplicated: Secondary | ICD-10-CM | POA: Diagnosis not present

## 2014-03-15 DIAGNOSIS — Z136 Encounter for screening for cardiovascular disorders: Secondary | ICD-10-CM | POA: Insufficient documentation

## 2014-03-15 NOTE — Telephone Encounter (Signed)
I did talk with pt. He does have hernia per urologist. He was referred to general surgeon for appointnment to discuss likely surgery. I notified pt of his lab results.

## 2014-03-19 ENCOUNTER — Other Ambulatory Visit: Payer: Self-pay | Admitting: Medical

## 2014-03-20 ENCOUNTER — Ambulatory Visit (HOSPITAL_BASED_OUTPATIENT_CLINIC_OR_DEPARTMENT_OTHER)
Admission: RE | Admit: 2014-03-20 | Discharge: 2014-03-20 | Disposition: A | Discharge status: Home / Self Care | Payer: Managed Care, Other (non HMO) | Source: Ambulatory Visit | Attending: Body Imaging | Admitting: Body Imaging

## 2014-03-20 DIAGNOSIS — I2510 Atherosclerotic heart disease of native coronary artery without angina pectoris: Secondary | ICD-10-CM | POA: Diagnosis not present

## 2014-03-20 DIAGNOSIS — R911 Solitary pulmonary nodule: Secondary | ICD-10-CM | POA: Insufficient documentation

## 2014-03-20 DIAGNOSIS — Z87891 Personal history of nicotine dependence: Secondary | ICD-10-CM

## 2014-03-20 DIAGNOSIS — Z720 Tobacco use: Secondary | ICD-10-CM | POA: Diagnosis present

## 2014-03-20 DIAGNOSIS — I7090 Unspecified atherosclerosis: Secondary | ICD-10-CM | POA: Diagnosis not present

## 2014-03-26 ENCOUNTER — Telehealth: Payer: Self-pay | Admitting: Medical

## 2014-03-26 NOTE — Telephone Encounter (Signed)
I called twice but no answer on his home phone or cell. So will ask LPN to send a letter notifying him we need to discuss CT chest report.

## 2014-03-29 NOTE — Telephone Encounter (Signed)
Letter printed and mailed to patient.

## 2014-04-02 ENCOUNTER — Encounter (INDEPENDENT_AMBULATORY_CARE_PROVIDER_SITE_OTHER): Payer: Self-pay | Admitting: General Surgery

## 2014-04-02 NOTE — Progress Notes (Unsigned)
Patient ID: Stephen Vincent, male   DOB: November 01, 1950, 63 y.o.   MRN: [id number]  Stephen Vincent 04/02/2014 2:55 PM Location: [facility] Patient #: [id number] DOB: 09-24-1950 Single / Language: Stephen Vincent / Race: Undefined Male History of Present Illness Stephen Vincent; 04/02/2014 5:47 PM) Patient words: umb hernia.  The patient is a 63 year old male    Note:He is referred by Stephen Vincent because of a large left inguinal hernia. He noted swelling in his left scrotal area earlier this year. An ultrasound was performed which demonstrated a heterogeneous mass extending from the inguinal canal down into the left hemiscrotum. Stephen Vincent saw him and noted him to have a large left inguinal hernia extending down to the left scrotum. When he is walking a lot he gets discomfort from this. He does do some heavy lifting at work. He denies any problems with urinating. He denies constipation.  Diagnostic Studies History Stephen Vincent, CMA; 04/02/2014 2:56 PM) Colonoscopy never  Allergies Stephen Vincent, CMA; 04/02/2014 2:57 PM) No Known Drug Allergies 04/02/2014  Medication History Stephen Vincent, CMA; 04/02/2014 2:57 PM) No Current Medications  Social History Stephen Vincent, [place]; 04/02/2014 2:56 PM) Caffeine use Coffee. Tobacco use Current every day smoker.     Review of Systems Stephen Vincent CMA; 04/02/2014 2:56 PM) General Not Present- Appetite Loss, Chills, Fatigue, Fever, Night Sweats, Weight Gain and Weight Loss. Skin Not Present- Change in Wart/Mole, Dryness, Hives, Jaundice, New Lesions, Non-Healing Wounds, Rash and Ulcer. HEENT Not Present- Earache, Hearing Loss, Hoarseness, Nose Bleed, Oral Ulcers, Ringing in the Ears, Seasonal Allergies, Sinus Pain, Sore Throat, Visual Disturbances, Wears glasses/contact lenses and Yellow Eyes. Respiratory Not Present- Bloody sputum, Chronic Cough, Difficulty Breathing, Snoring and Wheezing. Cardiovascular Not Present- Chest Pain,  Difficulty Breathing Lying Down, Leg Cramps, Palpitations, Rapid Heart Rate, Shortness of Breath and Swelling of Extremities. Male Genitourinary Not Present- Blood in Urine, Change in Urinary Stream, Frequency, Impotence, Nocturia, Painful Urination, Urgency and Urine Leakage.  Vitals (Stephen Vincent CMA; 04/02/2014 2:58 PM) 04/02/2014 2:57 PM Weight: 183 lb Height: 65in Body Surface Area: 1.95 m Body Mass Index: 30.45 kg/m Temp.: 97.63F(Temporal)  Pulse: 73 (Regular)  BP: 126/74 (Sitting, Left Arm, Standard)     Physical Exam Stephen Vincent; 04/02/2014 5:49 PM)  The physical exam findings are as follows: Note:General: WDWN in NAD. Pleasant and cooperative.  HEENT: [place]/AT, no facial masses  CV: RRR, no murmur, no JVD.  CHEST: Breath sounds equal and clear. Respirations nonlabored.  ABDOMEN: Soft, nontender, no umbilical hernia  GU: Large, partially reducible left inguinal bulge extending down into the left hemiscrotum, no right inguinal bulge.  NEUROLOGIC: Alert and oriented, answers questions appropriately, normal gait and station.  PSYCHIATRIC: Normal mood, affect , and behavior.    Assessment & Plan Stephen Vincent; 04/02/2014 5:52 PM)  HERNIA, INGUINAL, LEFT (550.90  K40.90) Impression: Large left inguinal hernia extending down into the scrotum that is symptomatic. I recommended open left inguinal hernia repair with mesh.  Plan: Open left inguinal hernia repair with mesh. I have explained the procedure, risks, aftercare, and time of work of inguinal hernia repair to him and his son (by phone for son). Risks include but are not limited to bleeding, infection, wound problems, anesthesia, recurrence, bladder or intestine injury, urinary retention, testicular dysfunction, chronic pain, mesh problems. They seem to understand and agree to proceed. Literature was given to him about hernia repair in [place] and [place].  Stephen Vincent

## 2014-06-05 ENCOUNTER — Encounter (HOSPITAL_COMMUNITY): Payer: Self-pay

## 2014-06-05 ENCOUNTER — Encounter (HOSPITAL_COMMUNITY)
Admission: RE | Admit: 2014-06-05 | Discharge: 2014-06-05 | Disposition: A | Discharge status: Home / Self Care | Payer: Managed Care, Other (non HMO) | Source: Ambulatory Visit | Attending: General Surgery | Admitting: General Surgery

## 2014-06-05 DIAGNOSIS — F17210 Nicotine dependence, cigarettes, uncomplicated: Secondary | ICD-10-CM | POA: Diagnosis not present

## 2014-06-05 DIAGNOSIS — K4090 Unilateral inguinal hernia, without obstruction or gangrene, not specified as recurrent: Secondary | ICD-10-CM | POA: Diagnosis present

## 2014-06-05 DIAGNOSIS — K219 Gastro-esophageal reflux disease without esophagitis: Secondary | ICD-10-CM | POA: Diagnosis not present

## 2014-06-05 HISTORY — DX: Gastro-esophageal reflux disease without esophagitis: K21.9

## 2014-06-05 LAB — CBC WITH DIFFERENTIAL/PLATELET
BASOS PCT: 0 % (ref 0–1)
Basophils Absolute: 0 10*3/uL (ref 0.0–0.1)
Eosinophils Absolute: 0.7 10*3/uL (ref 0.0–0.7)
Eosinophils Relative: 9 % — ABNORMAL HIGH (ref 0–5)
HEMATOCRIT: 48.3 % (ref 39.0–52.0)
Hemoglobin: 16.6 g/dL (ref 13.0–17.0)
LYMPHS PCT: 24 % (ref 12–46)
Lymphs Abs: 1.8 10*3/uL (ref 0.7–4.0)
MCH: 29.5 pg (ref 26.0–34.0)
MCHC: 34.4 g/dL (ref 30.0–36.0)
MCV: 85.9 fL (ref 78.0–100.0)
MONOS PCT: 5 % (ref 3–12)
Monocytes Absolute: 0.4 10*3/uL (ref 0.1–1.0)
NEUTROS ABS: 4.7 10*3/uL (ref 1.7–7.7)
NEUTROS PCT: 62 % (ref 43–77)
Platelets: ADEQUATE 10*3/uL (ref 150–400)
RBC: 5.62 MIL/uL (ref 4.22–5.81)
RDW: 13.2 % (ref 11.5–15.5)
WBC: 7.6 10*3/uL (ref 4.0–10.5)

## 2014-06-05 LAB — COMPREHENSIVE METABOLIC PANEL
ALK PHOS: 94 U/L (ref 39–117)
ALT: 18 U/L (ref 0–53)
ANION GAP: 6 (ref 5–15)
AST: 21 U/L (ref 0–37)
Albumin: 4.3 g/dL (ref 3.5–5.2)
BUN: 17 mg/dL (ref 6–23)
CHLORIDE: 108 meq/L (ref 96–112)
CO2: 24 mmol/L (ref 19–32)
CREATININE: 0.97 mg/dL (ref 0.50–1.35)
Calcium: 9.5 mg/dL (ref 8.4–10.5)
GFR calc Af Amer: >90 mL/min (ref >90)
GFR, EST NON AFRICAN AMERICAN: 85 mL/min — AB (ref >90)
GLUCOSE: 108 mg/dL — AB (ref 70–99)
POTASSIUM: 4.3 mmol/L (ref 3.5–5.1)
SODIUM: 138 mmol/L (ref 135–145)
Total Bilirubin: 1.2 mg/dL (ref 0.3–1.2)
Total Protein: 7.3 g/dL (ref 6.0–8.3)

## 2014-06-05 LAB — PROTIME-INR
INR: 1.01 (ref 0.00–1.49)
Prothrombin Time: 13.5 seconds (ref 11.6–15.2)

## 2014-06-05 NOTE — Pre-Procedure Instructions (Signed)
Stephen Vincent  06/05/2014   Your procedure is scheduled on:  Tuesday, January 26th  Report to [facility] Admitting at 530 AM.  Call this number if you have problems the morning of surgery: [phone]   Remember:   Do not eat food or drink liquids after midnight.   Take these medicines the morning of surgery with A SIP OF WATER: none   Do not wear jewelry  Do not wear lotions, powders, or perfumes, deodorant.  Do not shave 48 hours prior to surgery. Men may shave face and neck.  Do not bring valuables to the hospital.  [facility] is not responsible  for any belongings or valuables.               Contacts, dentures or bridgework may not be worn into surgery.  Leave suitcase in the car. After surgery it may be brought to your room.  For patients admitted to the hospital, discharge time is determined by your  treatment team.               Patients discharged the day of surgery will not be allowed to drive home.  Please read over the following fact sheets that you were given: Pain Booklet, Coughing and Deep Breathing and Surgical Site Infection Prevention  [facility] - Preparing for Surgery  Before surgery, you can play an important role.  Because skin is not sterile, your skin needs to be as free of germs as possible.  You can reduce the number of germs on you skin by washing with CHG (chlorahexidine gluconate) soap before surgery.  CHG is an antiseptic cleaner which kills germs and bonds with the skin to continue killing germs even after washing.  Please DO NOT use if you have an allergy to CHG or antibacterial soaps.  If your skin becomes reddened/irritated stop using the CHG and inform your nurse when you arrive at Short Stay.  Do not shave (including legs and underarms) for at least 48 hours prior to the first CHG shower.  You may shave your face.  Please follow these instructions carefully:   1.  Shower with CHG Soap the night before surgery and the morning of  Surgery.  2.  If you choose to wash your hair, wash your hair first as usual with your normal shampoo.  3.  After you shampoo, rinse your hair and body thoroughly to remove the shampoo.  4.  Use CHG as you would any other liquid soap.  You can apply CHG directly to the skin and wash gently with scrungie or a clean washcloth.  5.  Apply the CHG Soap to your body ONLY FROM THE NECK DOWN.  Do not use on open wounds or open sores.  Avoid contact with your eyes, ears, mouth and genitals (private parts).  Wash genitals (private parts) with your normal soap.  6.  Wash thoroughly, paying special attention to the area where your surgery will be performed.  7.  Thoroughly rinse your body with warm water from the neck down.  8.  DO NOT shower/wash with your normal soap after using and rinsing off the CHG Soap.  9.  Pat yourself dry with a clean towel.            10.  Wear clean pajamas.            11.  Place clean sheets on your bed the night of your first shower and do not sleep with pets.  Day of Surgery  Do not apply any lotions/deoderants the morning of surgery.  Please wear clean clothes to the hospital/surgery center.

## 2014-06-05 NOTE — Progress Notes (Signed)
Primary - saw doctor in clinic at [facility] for physicals  No cardiologist No cardiac testing  All of PAT was performed with assistance of intepretor line and then with interpretor sylvia sobalvarro

## 2014-06-11 ENCOUNTER — Encounter (HOSPITAL_COMMUNITY): Payer: Self-pay | Admitting: Certified Registered Nurse Anesthetist

## 2014-06-11 MED ORDER — CEFAZOLIN SODIUM-DEXTROSE 2-3 GM-% IV SOLR
2.0000 g | INTRAVENOUS | Status: AC
  Administered 2014-06-12: 2 g via INTRAVENOUS
  Filled 2014-06-11: qty 50

## 2014-06-12 ENCOUNTER — Ambulatory Visit (HOSPITAL_COMMUNITY): Payer: Managed Care, Other (non HMO) | Admitting: Certified Registered Nurse Anesthetist

## 2014-06-12 ENCOUNTER — Ambulatory Visit (HOSPITAL_COMMUNITY)
Admission: RE | Admit: 2014-06-12 | Discharge: 2014-06-12 | Disposition: A | Discharge status: Home / Self Care | Payer: Managed Care, Other (non HMO) | Source: Ambulatory Visit | Attending: General Surgery | Admitting: General Surgery

## 2014-06-12 ENCOUNTER — Encounter (HOSPITAL_COMMUNITY)
Admission: RE | Disposition: A | Discharge status: Home / Self Care | Payer: Self-pay | Source: Ambulatory Visit | Attending: General Surgery

## 2014-06-12 DIAGNOSIS — K4090 Unilateral inguinal hernia, without obstruction or gangrene, not specified as recurrent: Secondary | ICD-10-CM | POA: Insufficient documentation

## 2014-06-12 DIAGNOSIS — K219 Gastro-esophageal reflux disease without esophagitis: Secondary | ICD-10-CM | POA: Insufficient documentation

## 2014-06-12 DIAGNOSIS — F17210 Nicotine dependence, cigarettes, uncomplicated: Secondary | ICD-10-CM | POA: Insufficient documentation

## 2014-06-12 HISTORY — PX: INGUINAL HERNIA REPAIR: SHX194

## 2014-06-12 SURGERY — REPAIR, HERNIA, INGUINAL, ADULT
Anesthesia: General | Site: Groin | Laterality: Left

## 2014-06-12 MED ORDER — MEPERIDINE HCL 25 MG/ML IJ SOLN: 6.2500 mg | INTRAMUSCULAR | Status: DC | PRN

## 2014-06-12 MED ORDER — BUPIVACAINE-EPINEPHRINE (PF) 0.5% -1:200000 IJ SOLN
INTRAMUSCULAR | Status: AC
  Filled 2014-06-12: qty 30

## 2014-06-12 MED ORDER — ARTIFICIAL TEARS OP OINT
TOPICAL_OINTMENT | OPHTHALMIC | Status: DC | PRN
  Administered 2014-06-12: 1 via OPHTHALMIC

## 2014-06-12 MED ORDER — PROPOFOL 10 MG/ML IV BOLUS
INTRAVENOUS | Status: DC | PRN
  Administered 2014-06-12: 50 mg via INTRAVENOUS
  Administered 2014-06-12 (×2): 40 mg via INTRAVENOUS
  Administered 2014-06-12: 20 mg via INTRAVENOUS
  Administered 2014-06-12: 160 mg via INTRAVENOUS

## 2014-06-12 MED ORDER — OXYCODONE HCL 5 MG PO TABS: 5.0000 mg | ORAL_TABLET | ORAL | Status: DC | PRN

## 2014-06-12 MED ORDER — PROPOFOL 10 MG/ML IV BOLUS
INTRAVENOUS | Status: AC
  Filled 2014-06-12: qty 20

## 2014-06-12 MED ORDER — LIDOCAINE HCL (CARDIAC) 20 MG/ML IV SOLN
INTRAVENOUS | Status: AC
  Filled 2014-06-12: qty 5

## 2014-06-12 MED ORDER — LIDOCAINE HCL (CARDIAC) 20 MG/ML IV SOLN
INTRAVENOUS | Status: DC | PRN
  Administered 2014-06-12: 50 mg via INTRAVENOUS

## 2014-06-12 MED ORDER — ONDANSETRON HCL 4 MG/2ML IJ SOLN
INTRAMUSCULAR | Status: DC | PRN
  Administered 2014-06-12: 4 mg via INTRAVENOUS

## 2014-06-12 MED ORDER — DEXAMETHASONE SODIUM PHOSPHATE 4 MG/ML IJ SOLN
INTRAMUSCULAR | Status: DC | PRN
  Administered 2014-06-12: 8 mg via INTRAVENOUS

## 2014-06-12 MED ORDER — ONDANSETRON HCL 4 MG/2ML IJ SOLN
INTRAMUSCULAR | Status: AC
  Filled 2014-06-12: qty 2

## 2014-06-12 MED ORDER — MIDAZOLAM HCL 2 MG/2ML IJ SOLN
INTRAMUSCULAR | Status: AC
  Filled 2014-06-12: qty 2

## 2014-06-12 MED ORDER — FENTANYL CITRATE 0.05 MG/ML IJ SOLN
INTRAMUSCULAR | Status: DC | PRN
  Administered 2014-06-12 (×4): 50 ug via INTRAVENOUS
  Administered 2014-06-12 (×2): 25 ug via INTRAVENOUS

## 2014-06-12 MED ORDER — LACTATED RINGERS IV SOLN
INTRAVENOUS | Status: DC | PRN
  Administered 2014-06-12 (×2): via INTRAVENOUS

## 2014-06-12 MED ORDER — 0.9 % SODIUM CHLORIDE (POUR BTL) OPTIME
TOPICAL | Status: DC | PRN
  Administered 2014-06-12: 1000 mL

## 2014-06-12 MED ORDER — ONDANSETRON HCL 4 MG/2ML IJ SOLN: 4.0000 mg | Freq: Once | INTRAMUSCULAR | Status: DC | PRN

## 2014-06-12 MED ORDER — BUPIVACAINE-EPINEPHRINE 0.5% -1:200000 IJ SOLN
INTRAMUSCULAR | Status: DC | PRN
  Administered 2014-06-12: 30 mL

## 2014-06-12 MED ORDER — FENTANYL CITRATE 0.05 MG/ML IJ SOLN
INTRAMUSCULAR | Status: AC
  Filled 2014-06-12: qty 5

## 2014-06-12 MED ORDER — MIDAZOLAM HCL 5 MG/5ML IJ SOLN
INTRAMUSCULAR | Status: DC | PRN
  Administered 2014-06-12: 2 mg via INTRAVENOUS

## 2014-06-12 MED ORDER — ARTIFICIAL TEARS OP OINT
TOPICAL_OINTMENT | OPHTHALMIC | Status: AC
  Filled 2014-06-12: qty 3.5

## 2014-06-12 MED ORDER — DEXAMETHASONE SODIUM PHOSPHATE 4 MG/ML IJ SOLN
INTRAMUSCULAR | Status: AC
  Filled 2014-06-12: qty 2

## 2014-06-12 MED ORDER — HYDROMORPHONE HCL 1 MG/ML IJ SOLN: 0.2500 mg | INTRAMUSCULAR | Status: DC | PRN

## 2014-06-12 SURGICAL SUPPLY — 44 items
BENZOIN TINCTURE PRP APPL 2/3 (GAUZE/BANDAGES/DRESSINGS) ×2 IMPLANT
BLADE SURG 10 STRL SS (BLADE) ×2 IMPLANT
BLADE SURG 15 STRL LF DISP TIS (BLADE) ×1 IMPLANT
BLADE SURG 15 STRL SS (BLADE) ×1
BLADE SURG ROTATE 9660 (MISCELLANEOUS) ×2 IMPLANT
CHLORAPREP W/TINT 26ML (MISCELLANEOUS) ×2 IMPLANT
CLSR STERI-STRIP ANTIMIC 1/2X4 (GAUZE/BANDAGES/DRESSINGS) ×2 IMPLANT
COVER SURGICAL LIGHT HANDLE (MISCELLANEOUS) ×2 IMPLANT
DRAIN PENROSE 1/2X12 LTX STRL (WOUND CARE) ×2 IMPLANT
DRAPE INCISE IOBAN 66X45 STRL (DRAPES) ×2 IMPLANT
DRAPE LAPAROTOMY TRNSV 102X78 (DRAPE) ×2 IMPLANT
DRAPE UTILITY XL STRL (DRAPES) ×2 IMPLANT
DRSG TEGADERM 4X4.75 (GAUZE/BANDAGES/DRESSINGS) ×2 IMPLANT
DRSG TELFA 3X8 NADH (GAUZE/BANDAGES/DRESSINGS) ×2 IMPLANT
ELECT CAUTERY BLADE 6.4 (BLADE) ×2 IMPLANT
ELECT REM PT RETURN 9FT ADLT (ELECTROSURGICAL) ×2
ELECTRODE REM PT RTRN 9FT ADLT (ELECTROSURGICAL) ×1 IMPLANT
GAUZE SPONGE 4X4 16PLY XRAY LF (GAUZE/BANDAGES/DRESSINGS) ×2 IMPLANT
GLOVE BIO SURGEON STRL SZ7 (GLOVE) ×2 IMPLANT
GLOVE BIOGEL PI IND STRL 8 (GLOVE) ×1 IMPLANT
GLOVE BIOGEL PI INDICATOR 8 (GLOVE) ×1
GLOVE ECLIPSE 8.0 STRL XLNG CF (GLOVE) ×2 IMPLANT
GOWN STRL REUS W/ TWL LRG LVL3 (GOWN DISPOSABLE) ×2 IMPLANT
GOWN STRL REUS W/TWL LRG LVL3 (GOWN DISPOSABLE) ×2
KIT BASIN OR (CUSTOM PROCEDURE TRAY) ×2 IMPLANT
KIT ROOM TURNOVER OR (KITS) ×2 IMPLANT
MESH HERNIA 3X6 (Mesh General) ×2 IMPLANT
NEEDLE HYPO 25GX1X1/2 BEV (NEEDLE) ×2 IMPLANT
NS IRRIG 1000ML POUR BTL (IV SOLUTION) ×2 IMPLANT
PACK SURGICAL SETUP 50X90 (CUSTOM PROCEDURE TRAY) ×2 IMPLANT
PAD ARMBOARD 7.5X6 YLW CONV (MISCELLANEOUS) ×2 IMPLANT
PENCIL BUTTON HOLSTER BLD 10FT (ELECTRODE) ×2 IMPLANT
SPONGE LAP 18X18 X RAY DECT (DISPOSABLE) ×2 IMPLANT
STRIP CLOSURE SKIN 1/2X4 (GAUZE/BANDAGES/DRESSINGS) ×2 IMPLANT
SUT MON AB 4-0 PC3 18 (SUTURE) ×2 IMPLANT
SUT PROLENE 2 0 CT2 30 (SUTURE) ×4 IMPLANT
SUT SILK 2 0 SH (SUTURE) IMPLANT
SUT VIC AB 2-0 SH 18 (SUTURE) ×4 IMPLANT
SUT VIC AB 3-0 SH 27 (SUTURE) ×2
SUT VIC AB 3-0 SH 27XBRD (SUTURE) ×2 IMPLANT
SUT VICRYL AB 3 0 TIES (SUTURE) ×2 IMPLANT
SYR CONTROL 10ML LL (SYRINGE) ×2 IMPLANT
TOWEL OR 17X24 6PK STRL BLUE (TOWEL DISPOSABLE) ×2 IMPLANT
TOWEL OR 17X26 10 PK STRL BLUE (TOWEL DISPOSABLE) ×2 IMPLANT

## 2014-06-12 NOTE — Interval H&P Note (Signed)
History and Physical Interval Note:  06/12/2014 7:31 AM  Stephen Vincent  has presented today for surgery, with the diagnosis of LEFT INGUINAL HERNIA  The various methods of treatment have been discussed with the patient and family. After consideration of risks, benefits and other options for treatment, the patient has consented to  Procedure(s): LEFT INGUINAL HERNIA REPAIR WITH MESH (Left) as a surgical intervention .  The patient's history has been reviewed, patient examined, no change in status, stable for surgery.  I have reviewed the patient's chart and labs.  Questions were answered to the patient's satisfaction.     Celso Granja Lenna Sciara

## 2014-06-12 NOTE — Anesthesia Postprocedure Evaluation (Signed)
Anesthesia Post Note  Patient: Stephen Vincent  Procedure(s) Performed: Procedure(s) (LRB): LEFT INGUINAL HERNIA REPAIR WITH MESH (Left)  Anesthesia type: general  Patient location: PACU  Post pain: Pain level controlled  Post assessment: Patient's Cardiovascular Status Stable  Last Vitals:  Filed Vitals:   06/12/14 1140  BP: 157/95  Pulse: 80  Temp:   Resp:     Post vital signs: Reviewed and stable  Level of consciousness: sedated  Complications: No apparent anesthesia complications

## 2014-06-12 NOTE — Transfer of Care (Signed)
Immediate Anesthesia Transfer of Care Note  Patient: Stephen Vincent  Procedure(s) Performed: Procedure(s): LEFT INGUINAL HERNIA REPAIR WITH MESH (Left)  Patient Location: PACU  Anesthesia Type:General  Level of Consciousness: awake, oriented, patient cooperative and responds to stimulation  Airway & Oxygen Therapy: Patient Spontanous Breathing and Patient connected to nasal cannula oxygen  Post-op Assessment: Report given to PACU RN and Post -op Vital signs reviewed and stable  Post vital signs: Reviewed and stable  Complications: No apparent anesthesia complications

## 2014-06-12 NOTE — Brief Op Note (Signed)
06/12/2014  9:23 AM  PATIENT:  Stephen Vincent  64 y.o. male  PRE-OPERATIVE DIAGNOSIS:  LEFT INGUINAL HERNIA  POST-OPERATIVE DIAGNOSIS:  LEFT INGUINAL HERNIA  PROCEDURE:  Procedure(s): LEFT INGUINAL HERNIA REPAIR WITH MESH (Left)  SURGEON:  Surgeon(s) and Role:    * Jackolyn Confer, MD - Primary   ANESTHESIA:   general   LOCAL MEDICATIONS USED:  MARCAINE     SPECIMEN:  No Specimen  COUNTS:  YES  TOURNIQUET:  * No tourniquets in log *  DICTATION: .Dragon Dictation  PLAN OF CARE: Discharge to home after PACU  PATIENT DISPOSITION:  PACU - hemodynamically stable.   Delay start of Pharmacological VTE agent (>24hrs) due to surgical blood loss or risk of bleeding: not applicable

## 2014-06-12 NOTE — Op Note (Signed)
Preoperative diagnosis:  Large left inguinal hernia.  Postoperative diagnosis:  Same  Procedure:  Left inguinal hernia repair with mesh.  Surgeon:  Jackolyn Confer, M.D.  Anesthesia:  General/LMA with local (Marcaine).  Indication:  This is a 64 year old male with a large left inguinal hernia extending down into the scrotum. He now presents for elective repair.  Technique:  He was seen in the holding room and the left groin was marked with my initials. He was brought to the operating, placed supine on the operating table, and the anesthetic was administered by the anesthesiologist. The hair in the groin area was clipped as was felt to be necessary.  The left inguinal hernia was manually reduced. This area was then sterilely prepped and draped.  Local anesthetic was infiltrated in the superficial and deep tissues in the left groin.  An incision was made through the skin and subcutaneous tissue until the external oblique aponeurosis was identified.  Local anesthetic was infiltrated deep to the external oblique aponeurosis.  There was a small defect in the superior aspect of the external oblique aponeurosis. The external oblique aponeurosis was divided through the external ring medially and back toward the anterior superior iliac spine laterally. Using blunt dissection, the shelving edge of the inguinal ligament was identified inferiorly and the internal oblique aponeurosis and muscle were identified superiorly.  These both appeared to be weak and attenuated. There was a large indirect hernia present with the hernia sac extending down into the scrotum and attached to the tunica vaginalis.  The spermatic cord was isolated and a posterior window was made around it. Using careful blunt and sharp dissection, the hernia sac was separated from the tunica vaginalis and the spermatic cord contents and reduced through a very large indirect hernia defect.  Next, a piece of 3" x 6" polypropylene mesh was brought  into the field and anchored 1-2 cm medial to the pubic tubercle with 2-0 Prolene suture. The inferior aspect of the mesh was anchored to the shelving edge of the inguinal ligament with running 2-0 Prolene suture to a level 1-2 cm lateral to the internal ring. A slit was cut in the mesh creating 2 tails. These were wrapped around the spermatic cord. The superior aspect of the mesh was anchored to the internal oblique aponeurosis and muscle with interrupted 2-0 Vicryl sutures.  Some of the sutures had to be replaced as they tore  through the attenuated tissue. The 2 tails of the mesh were then crossed creating a new internal ring and were anchored to the shelving edge of the inguinal ligament with 2-0 Prolene suture. The tip of a hemostat could be placed through the new aperture. The lateral aspect of the mesh was then tucked deep to the external oblique aponeurosis.  The wound was inspected and hemostasis was adequate. The external oblique aponeurosis was then closed over the mesh and cord with running 3-0 Vicryl suture. The subcutaneous tissue was closed with running 3-0 Vicryl suture. The skin closed with a running 4-0 Monocryl subcuticular stitch.  Steri-Strips and a sterile dressing were applied.  The procedure was well-tolerated without any apparent complications and he was taken to the recovery room in satisfactory condition.

## 2014-06-12 NOTE — H&P (Signed)
Stephen Vincent is an 64 y.o. male.   Chief Complaint:   Here for elective [facility] repair HPI:  He noted swelling in his left scrotal area earlier this year. An ultrasound was performed which demonstrated a heterogeneous mass extending from the inguinal canal down into the left hemiscrotum. Dr. Karsten Ro saw him and noted him to have a large left inguinal hernia extending down to the left scrotum. When he is walking a lot he gets discomfort from this. He does do some heavy lifting at work. He denies any problems with urinating. He denies constipation.  Past Medical History  Diagnosis Date  . GERD (gastroesophageal reflux disease)     History reviewed. No pertinent past surgical history.  Family History  Problem Relation Age of Onset  . Cancer Mother    Social History:  reports that he has been smoking Cigarettes and E-cigarettes.  He has a 4.5 pack-year smoking history. He has never used smokeless tobacco. He reports that he drinks alcohol. He reports that he does not use illicit drugs.  Allergies: No Known Allergies  Medications Prior to Admission  Medication Sig Dispense Refill  . Multiple Vitamins-Minerals (MULTIVITAMIN & MINERAL PO) Take 1 tablet by mouth 2 (two) times daily.     . ranitidine (ZANTAC) 150 MG tablet Take 150 mg by mouth 2 (two) times daily as needed for heartburn.      No results found for this or any previous visit (from the past 48 hour(s)). No results found.  Review of Systems  Constitutional: Negative for fever.  Gastrointestinal: Negative for nausea and vomiting.    Blood pressure 144/80, pulse 76, temperature 97.7 F (36.5 C), temperature source Oral, resp. rate 18, weight 184 lb (83.462 kg), SpO2 96 %. Physical Exam  Constitutional: He appears well-developed and well-nourished. No distress.  Cardiovascular: Normal rate and regular rhythm.   Respiratory: Effort normal and breath sounds normal.  Genitourinary:  Large left inguinal bulge  Neurological:  He is alert.  Psychiatric: He has a normal mood and affect. His behavior is normal.     Assessment/Plan Large LIH  Plan:  [facility] repair with mesh.  Stephen Vincent J 06/12/2014, 7:30 AM

## 2014-06-12 NOTE — Anesthesia Preprocedure Evaluation (Addendum)
Anesthesia Evaluation  Patient identified by MRN, date of birth, ID band Patient awake    Reviewed: Allergy & Precautions, NPO status , Patient's Chart, lab work & pertinent test results  Airway Mallampati: II  TM Distance: >3 FB Neck ROM: Full    Dental  (+) Teeth Intact, Partial Upper, Dental Advisory Given   Pulmonary Current Smoker,  breath sounds clear to auscultation        Cardiovascular negative cardio ROS  Rhythm:Regular Rate:Normal     Neuro/Psych negative neurological ROS     GI/Hepatic Neg liver ROS, GERD-  Medicated and Controlled,  Endo/Other  negative endocrine ROS  Renal/GU negative Renal ROS     Musculoskeletal   Abdominal Normal abdominal exam  (+)   Peds  Hematology   Anesthesia Other Findings   Reproductive/Obstetrics                            Anesthesia Physical Anesthesia Plan  ASA: II  Anesthesia Plan: General   Post-op Pain Management:    Induction: Intravenous  Airway Management Planned: LMA  Additional Equipment:   Intra-op Plan:   Post-operative Plan: Extubation in OR  Informed Consent: I have reviewed the patients History and Physical, chart, labs and discussed the procedure including the risks, benefits and alternatives for the proposed anesthesia with the patient or authorized representative who has indicated his/her understanding and acceptance.   Dental advisory given  Plan Discussed with: Surgeon and CRNA  Anesthesia Plan Comments:        Anesthesia Quick Evaluation

## 2014-06-14 ENCOUNTER — Encounter (HOSPITAL_COMMUNITY): Payer: Self-pay | Admitting: General Surgery

## 2014-06-14 ENCOUNTER — Telehealth (INDEPENDENT_AMBULATORY_CARE_PROVIDER_SITE_OTHER): Payer: Self-pay | Admitting: Surgery

## 2014-06-14 NOTE — Telephone Encounter (Signed)
Stephen Vincent had a left inguinal hernia repair by Dr. Zella Richer on 06/12/2014.  His son called worried about swelling, particularly in the scrotum, and some pain under his ribs on the left.  From what he is describing, this sounds like a normal post op course.  The patient is worried about the hernia recurring.  I told him to address this with Dr. Zella Richer when he sees him back.  He will call our office if he has further questions.  [name], MD, [facility] Surgery Pager: [phone] Office phone:  [phone]

## 2014-06-18 NOTE — Telephone Encounter (Signed)
Note already written.  I cannot close this without an entry.  [name], MD, [facility] Surgery Pager: [phone] Office phone:  [phone]

## 2015-03-19 ENCOUNTER — Encounter: Payer: Self-pay | Admitting: Behavioral Health

## 2015-03-19 ENCOUNTER — Telehealth: Payer: Self-pay | Admitting: Behavioral Health

## 2015-03-19 NOTE — Telephone Encounter (Signed)
Pre-Visit Call completed with patient and chart updated.   Pre-Visit Info documented in Specialty Comments under [employer].    

## 2015-03-20 ENCOUNTER — Ambulatory Visit (INDEPENDENT_AMBULATORY_CARE_PROVIDER_SITE_OTHER): Payer: Managed Care, Other (non HMO) | Admitting: Medical

## 2015-03-20 ENCOUNTER — Encounter: Payer: Self-pay | Admitting: Medical

## 2015-03-20 ENCOUNTER — Encounter: Payer: Self-pay | Admitting: Internal Medicine

## 2015-03-20 VITALS — BP 116/70 | HR 67 | Temp 97.5°F | Ht 66.0 in | Wt 177.0 lb

## 2015-03-20 DIAGNOSIS — Z720 Tobacco use: Secondary | ICD-10-CM

## 2015-03-20 DIAGNOSIS — Z1211 Encounter for screening for malignant neoplasm of colon: Secondary | ICD-10-CM

## 2015-03-20 DIAGNOSIS — Z23 Encounter for immunization: Secondary | ICD-10-CM

## 2015-03-20 DIAGNOSIS — Z0000 Encounter for general adult medical examination without abnormal findings: Secondary | ICD-10-CM | POA: Diagnosis not present

## 2015-03-20 DIAGNOSIS — F17200 Nicotine dependence, unspecified, uncomplicated: Secondary | ICD-10-CM

## 2015-03-20 DIAGNOSIS — Z125 Encounter for screening for malignant neoplasm of prostate: Secondary | ICD-10-CM

## 2015-03-20 DIAGNOSIS — R319 Hematuria, unspecified: Secondary | ICD-10-CM

## 2015-03-20 LAB — COMPREHENSIVE METABOLIC PANEL
ALT: 13 U/L (ref 0–53)
AST: 14 U/L (ref 0–37)
Albumin: 4.1 g/dL (ref 3.5–5.2)
Alkaline Phosphatase: 102 U/L (ref 39–117)
BUN: 15 mg/dL (ref 6–23)
CALCIUM: 9.6 mg/dL (ref 8.4–10.5)
CHLORIDE: 105 meq/L (ref 96–112)
CO2: 31 mEq/L (ref 19–32)
Creatinine, Ser: 0.9 mg/dL (ref 0.40–1.50)
GFR: 89.65 mL/min (ref >60.00)
Glucose, Bld: 92 mg/dL (ref 70–99)
POTASSIUM: 4.3 meq/L (ref 3.5–5.1)
Sodium: 141 mEq/L (ref 135–145)
Total Bilirubin: 0.9 mg/dL (ref 0.2–1.2)
Total Protein: 7 g/dL (ref 6.0–8.3)

## 2015-03-20 LAB — POCT URINALYSIS DIPSTICK
Bilirubin, UA: NEGATIVE
GLUCOSE UA: NEGATIVE
KETONES UA: NEGATIVE
LEUKOCYTES UA: NEGATIVE
Nitrite, UA: NEGATIVE
PROTEIN UA: NEGATIVE
Spec Grav, UA: 1.025
Urobilinogen, UA: 0.2
pH, UA: 6

## 2015-03-20 LAB — CBC WITH DIFFERENTIAL/PLATELET
BASOS ABS: 0.1 10*3/uL (ref 0.0–0.1)
BASOS PCT: 0.9 % (ref 0.0–3.0)
Eosinophils Absolute: 0.8 10*3/uL — ABNORMAL HIGH (ref 0.0–0.7)
Eosinophils Relative: 12.9 % — ABNORMAL HIGH (ref 0.0–5.0)
HEMATOCRIT: 46.1 % (ref 39.0–52.0)
Hemoglobin: 15.4 g/dL (ref 13.0–17.0)
LYMPHS ABS: 2 10*3/uL (ref 0.7–4.0)
LYMPHS PCT: 30.3 % (ref 12.0–46.0)
MCHC: 33.4 g/dL (ref 30.0–36.0)
MCV: 87.9 fl (ref 78.0–100.0)
Monocytes Absolute: 0.4 10*3/uL (ref 0.1–1.0)
Monocytes Relative: 7 % (ref 3.0–12.0)
NEUTROS ABS: 3.2 10*3/uL (ref 1.4–7.7)
NEUTROS PCT: 48.9 % (ref 43.0–77.0)
PLATELETS: 249 10*3/uL (ref 150.0–400.0)
RBC: 5.25 Mil/uL (ref 4.22–5.81)
RDW: 13.4 % (ref 11.5–15.5)
WBC: 6.4 10*3/uL (ref 4.0–10.5)

## 2015-03-20 LAB — LIPID PANEL
CHOL/HDL RATIO: 3
CHOLESTEROL: 135 mg/dL (ref 0–200)
HDL: 49.1 mg/dL (ref >39.00)
LDL CALC: 74 mg/dL (ref 0–99)
NonHDL: 86.29
Triglycerides: 62 mg/dL (ref 0.0–149.0)
VLDL: 12.4 mg/dL (ref 0.0–40.0)

## 2015-03-20 LAB — PSA, MEDICARE: PSA: 0.57 ng/ml (ref 0.10–4.00)

## 2015-03-20 LAB — TSH: TSH: 0.76 u[IU]/mL (ref 0.35–4.50)

## 2015-03-20 MED ORDER — SILDENAFIL CITRATE 50 MG PO TABS: 50.0000 mg | ORAL_TABLET | Freq: Every day | ORAL | Status: AC | PRN

## 2015-03-20 NOTE — Addendum Note (Signed)
Addended by: Tasia Catchings on: 03/20/2015 02:02 PM   Modules accepted: Orders

## 2015-03-20 NOTE — Progress Notes (Signed)
Pre visit review using our clinic review tool, if applicable. No additional management support is needed unless otherwise documented below in the visit note. 

## 2015-03-20 NOTE — Patient Instructions (Addendum)
Wellness examination Cbc, cmp, tsh, lipid, psa and ua. Will refer for colonoscopy. Also will order ct of chest do to smoking hx.  Pt agreed to zostavaccine and flu vaccine today.    Note pt will come back to do G0 portion with [employer]. I asked them to schedule that in 2 wks.    Cuidados preventivos en los varones adultos (Preventive Care for Adults, Male) Un estilo de vida saludable y los cuidados preventivos pueden favorecer la salud y [place]. Las pautas de salud preventivas para los varones incluyen las siguientes prcticas clave:  Un examen fsico de rutina anual y Optometrist estudios preventivos es un buen modo de Chief Technology Officer su salud. [place] de Publishing rights manager preocupaciones y Civil engineer, contracting el estado de su salud, y que le realicen estudios completos.  Consulte al dentista para realizar un examen de rutina y cuidados preventivos cada 6 meses. Cepllese los dientes al [employer] veces por da y psese el hilo dental al menos una vez por da. Una buena higiene bucal evita caries y enfermedades de las encas.  La frecuencia con que debe hacerse exmenes de la vista depende de su edad, su estado de [place], su historia familiar, el uso de lentes de contacto y otros factores. Siga las recomendaciones del mdico para saber con qu frecuencia debe hacerse exmenes de la vista.  Consuma una dieta saludable. Los [employer] Corporation, frutas, cereales integrales, productos lcteos descremados y protenas magras contienen los nutrientes que usted necesita sin necesidad de consumir muchas caloras. Disminuya la ingesta de alimentos ricos en grasas slidas, azcares y sal agregadas. Consuma la cantidad de caloras adecuada para usted. Si es necesario, pdale informacin acerca de una dieta [place] a su mdico.  Realizar actividad fsica de forma regular es una de las prcticas ms importantes que puede hacer por su salud. Los adultos deben hacer al menos 150 minutos de ejercicios de intensidad  moderada (cualquier actividad que aumente la frecuencia cardaca y lo haga transpirar) cada semana. Adems, la [employer] de los adultos necesita practicar ejercicios de fortalecimiento muscular dos o ms veces por semana.  Mantenga un peso saludable. El ndice de masa corporal [facility]) es una herramienta que identifica posibles problemas con [place]. Proporciona una estimacin de la grasa corporal basndose en el peso y la altura. El mdico podr determinar su [facility] y ayudarlo a Scientist, forensic o Theatre manager un peso saludable. Para los adultos de 20 aos o ms:  Un [facility] menor de 18,5 se considera bajo peso.  Un [facility] entre 18,5 y 24,9 es normal.  Un [facility] entre 25 y 29,9 se considera sobrepeso.  Un IMC de 30 o ms se considera obesidad.  Realice actividad fsica y evite ingerir grasas saturadas para mantener un nivel normal de lpidos y Research scientist (life sciences). Consuma una dieta equilibrada y saludable, e incluya variedad de frutas y Photographer. A partir de los 20 aos se deben realizar anlisis de sangre a fin de Freight forwarder nivel de lpidos y colesterol en la sangre y [place] cada 5 aos. Si los niveles de lpidos o colesterol son altos, tiene ms de 50 aos o tiene riesgo elevado de sufrir enfermedades cardacas, Designer, industrial/product controlarse con ms frecuencia. Si tiene [employer] de lpidos y colesterol, debe recibir tratamiento con medicamentos, si la dieta y el ejercicio no estn funcionando.  Si fuma, consulte con el mdico acerca de las opciones para dejar de [place]. Si no consume tabaco, no comience.  Se recomienda realizar exmenes de deteccin de  cncer de pulmn a personas adultas entre 47 y 98 aos que estn en riesgo de Horticulturist, commercial de pulmn por sus antecedentes de consumo de tabaco. Para quienes hayan fumado durante 30 aos un paquete diario, y sigan fumando o hayan dejado el hbito en algn momento en los ltimos 15 aos, se recomienda realizarse una tomografa computarizada de baja dosis de los  pulmones todos los aos. Fumar 1paquete-ao equivale a fumar un promedio de 1paquete de cigarrillos diario durante 1ao (por ejemplo: 1paquete por da durante 30 aos o 2paquetes por da durante 15aos). Los exmenes anuales deben continuar hasta que el fumador haya dejado de fumar durante un mnimo de 15 aos. Ya no deben Emergency planning/management officer que tengan un problema de salud que les impida recibir tratamiento para el cncer de pulmn.  Si decide tomar alcohol, no beba ms de 2 medidas por da. Se considera una medida a 12onzas (35m) de cerveza, 5onzas (1457m de vino, o 1,1,[id number] licor.  Evite el consumo de drogas. No comparta las agujas. Pida ayuda si necesita asistencia o instrucciones con respecto a abandonar el consumo de drogas.  La hipertensin arterial causa enfermedades cardacas y auSerbial riesgo de ictus. Debe controlar su presin arterial al menos cada uno o doWiltonLa hipertensin arterial que persiste debe tratarse con medicamentos si la prdida de peso y el ejercicio no son efectivos.  Si tiene entre [phone], consulte a su mdico si debe tomar aspirina para prevenir enfermedades cardacas.  Los anC.H. Robinson Worldwidee deteccin de la diabetes se realizan extrayendo una muCouncee saBradgateara verificar el nivel sanguneo de glucosa despus de no haber comido durante determinado perodo (ayNavesink Si usted no tiene sobrepeso ni factores de riesgo de diabetes, deben hacerle estos anlisis una vez cada 3 aos a partir de los 4529os. Si usted tiene sobrepeso u obesidad y su edad es de 40 a 7076os, deben hacerle anlisis de deProgramme researcher, broadcasting/film/videoe la diabetes todos los aos como parte de la evaluacin del riesgo cardiovascular.  El cncer colorrectal puede detectarse y con frecuencia puede prevenirse. La mayor parte de los estudios de rutina se deben coMedical laboratory scientific officer haField seismologist paProofreadere los [id number] y haPlattsburgh552os. Sin embargo, el mdico podr aconsejarle que lo haga antes, si tiene  factores de riesgo para el cncer de colon. Una vez por ao, el mdico le dar un kit de prueba para haHydrologistn la materia fecal. La utilizacin de un tubo con una pequea cmara en su extremo para examinar directamente el colon (sigmoidoscopa o colonoscopa) puede detectar formas tempranas de cncer colorrectal. Hable con su mdico si tiene [id number], edad en la que debe comenzar a reOptometristos estudios de ruNepalEl examen directo del colon debe repetirse cada 5 a 10 aos, hasta los 75 aos, excepto que se encuentren formas tempranas de plipos precancerosos o pequeos bultos.  Las personas con un riesgo mayor de paInsurance risk surveyorepatitis B deben realizarse anlisis para deFutures traderirus. Se considera que tiene un alto riesgo de coMuseum/gallery curatorepatitis B si:  Naci en un pas donde la hepatitis B es frecuente. Pregntele a su mdico qu pases son considerados de alPublic affairs consultant Sus padres nacieron en un pas de alto riesgo y usted no recibi una vacuna que lo proteja contra la hepatitisB (vacuna contra la hepatitisB).  TiLas Cruces UsCanadagujas para inyectarse drogas.  Vive o tiene sexo con alguien que tiene hepatitis  B.  Es un hombre que tiene sexo con otros hombres.  Recibe tratamiento de hemodilisis.  Toma ciertos medicamentos para Nurse, mental health, trasplante de rganos y afecciones autoinmunitarias.  Se recomienda realizar un anlisis de sangre para Hydrographic surveyor hepatitis C a todas las personas nacidas entre 1945 y 1965, y a todo aquel que sepa que tiene riesgo de haber contrado esta enfermedad.  Practique el sexo seguro. Use condones y evite las prcticas sexuales riesgosas para disminuir el contagio de enfermedades de transmisin sexual (ETS). Algunas ETS son la gonorrea, clamidia, sfilis, tricomoniasis, herpes, VPH y el virus de inmunodeficiencia humana (VIH). El herpes, el VIH y Onset VPH son enfermedades virales que no tienen [place]. Pueden producir discapacidad, cncer y  [employer].  Si es un hombre sexualmente activo que tiene relaciones sexuales con otros hombres, por lo Occidental Petroleum vez al ao debe realizarse pruebas de deteccin de lo siguiente:  VIH.  Infeccin de la uretra, el recto y la faringe por gonorrea, clamidia o ambas.  Si tiene riesgo de infectarse por el VIH, se recomienda tomar diariamente un medicamento recetado para evitar la infeccin. Esto se conoce como profilaxis previa a la exposicin. Se considera que est en riesgo si:  Es un hombre que tiene sexo con otros hombres y tiene otros factores de Sales executive.  Es un hombre heterosexual, es sexualmente [place] y tiene ms riesgo de Museum/gallery curator una infeccin por VIH.  Se inyecta drogas.  Es [place] sexualmente con Ardelia Mems pareja que tiene VIH.  Consulte a su mdico para saber si tiene un alto riesgo de infectarse por el VIH. Si opta por comenzar la profilaxis previa a la exposicin, primero debe realizarse anlisis de deteccin del VIH. Luego, le harn anlisis cada 63mses mientras est tomando los medicamentos para la profilaxis previa a la exposicin.  Se recomienda qExelon [id number] [id number] que sean fumadores, o que lo haya sido, se realicen un estudio con ecografa para dHydrographic surveyoraneurisma de aorta abdominal (AAA) y su eventual reparacin qUnited [place]  Los hombres sanos no deben hacerse anlisis de sangre para dHydrographic surveyorantgenos especficos prostticos (PSA) como parte de los estudios de rutina para eScience writer Pregntele a su mdico sobre las pruebas de deteccin de cncer de prstata.  La evaluacin del cncer de testculos no se recomienda en varones adultos que no tengan sntomas. La evaluacin incluye el autoexamen, el examen por parte del mdico y otras pruebas diagnsticas. Consulte con su mdico si tiene algn sntoma o preocupaciones acerca del cncer de testculos.  Utilice pantalla solar. Aplique pantalla solar de mKerry Doryy repetida a lo largo del dTraining and development officer Resgurdese del sol  cuando la sombra sea ms pequea que usted. Protjase usando mangas y pantalones largos, un sombrero de ala ancha y anteojos para el sol todo el ao, siempre que se encuentre al aGordonsville  Una vez por mes, examnese la piel de todo el cuerpo y use un espejo para ver la espalda. Informe al mdico si aparecen nuevos lunares, o si nota que los que ya tena ahora tienen bordes iShannon aumentaron su tamao y son ms grandes que una goma de lpiz o si su forma o color cambi.  Mantngase al da con las vacunas obligatorias.  Vacuna antigripal. Todas las personas adultas deben vacunarse cada ao.  Vacuna contra la difteria, ttanos y pAdvice worker(DT, DTPa). Una persona adulta que no haya recibido la vacuna Tdap anteriormente o que no sabe su estado de vacunacin debe recibir una dosis.  Despus de esta dosis inicial, necesitar aplicarse un refuerzo de la vacuna contra la difteria y el ttanos (DT) cada 10 aos. Las Ship broker que no sepan o no hayan recibido la serie de vacunacin de tres dosis contra la difteria y el ttanos deben iniciar o finalizar una serie de vacunacin primaria, que incluye la dosis contra la difteria, el ttanos y Research officer, trade union (DTPa). Las personas adultas deben recibir una dosis de refuerzo de DT cada 10 aos.  Vacuna contra la varicela. Ardelia Mems persona adulta sin prueba de inmunidad a la varicela debe recibir dos dosis o una segunda dosis si recibi una dosis previamente.  Vacuna contra el virus del Engineer, technical sales (VPH). Los varones entre 11 y 38 aos que no hayan recibido la vacuna deben recibir la serie de 3 dosis. Los varones entre 22 y 55 aos pueden recibir la vacuna. Se recomienda la vacuna [employer] edad de 26 aos para todo hombre que mantenga relaciones sexuales con otros hombres y no haya recibido [place] o ninguna de las dosis anteriormente. Se recomienda la vacuna para cualquier persona inmunodeprimida hasta la edad de 26 aos si no recibi [place] o  ninguna de las dosis anteriormente. Durante la serie de 3 dosis, la segunda dosis debe [employer] 4 y 8 semanas posteriores a la primera dosis. La tercera dosis debe aplicarse 24 semanas despus de la primera dosis y 16 semanas despus de la segunda dosis.  Vacuna contra el herpes zoster. Se recomienda una dosis en personas [employer] de 62 aos a menos que sufran ciertas enfermedades.  Vacuna contra el sarampin, la rubola y las paperas ([place]). Los adultos nacidos antes de 1957 generalmente se consideran inmunes al sarampin y las paperas. Las Forensic scientist en [phone] o posteriormente deben recibir una o ms dosis de la vacuna SRP, a menos que [employer] Mutual [employer] Omaha contraindicacin para la vacuna o que tengan prueba de inmunidad a las tres enfermedades. Se debe aplicar una segunda dosis de rutina de la vacuna SRP al menos 28das despus de la primera dosis a estudiantes de escuelas terciarias, trabajadores de la salud o viajeros internacionales. Las personas que recibieron la vacuna inactivada contra el sarampin o algn tipo desconocido de vacuna contra el sarampin [place] y 1967 deben recibir dos dosis de la vacuna [place]. Las Advertising copywriter recibieron la vacuna inactivada contra las paperas o algn tipo desconocido de vacuna contra las paperas antes de 1979 y tienen un alto riesgo de infectarse con la enfermedad deben considerar vacunarse con dos dosis de la vacuna SRP. Los trabajadores de [employer]	[place] no vacunados que nacieron antes de 1957 y que no tienen prueba de inmunidad contra el sarampin, la rubola y las paperas o no tienen confirmacin de laboratorio de la enfermedad deben considerar vacunarse contra el sarampin y las paperas con dos dosis de la vacuna [place], y contra la rubola con una dosis de la vacuna SRP.  Vacuna antineumoccica conjugada 13 valente (PCV13). Segn indicacin mdica, una persona que no conozca su historia de vacunacin y no tenga registro de vacunas debe recibir la  vacuna PCV13. Todos los adultos de 65 aos o ms deben recibir esta vacuna. Una persona de 19 aos o ms que tenga ciertas enfermedades y no se haya vacunado debe recibir una dosis de la vacuna PCV13. Despus de esta vacuna, se debe aplicar una dosis de la vacuna antineumoccica de polisacridos (PPSV23). Los adultos con alto riesgo de enfermedad neumoccica deben recibir la vacuna PPSV23 al  menos 8 semanas despus de la dosis de la vacuna PCV13. Los adultos de ms de 65 aos cuyo sistema inmunitario funcione normalmente deben recibir la dosis de la vacuna PPSV23 al menos 1 ao despus de la dosis de la vacuna PCV13.  Vacuna antineumoccica de polisacridos (PPSV23). Si se indica la vacuna PCV13, primero debe recibir la vacuna PCV13. Todas las personas de 65 aos o mayores deben recibir la vacuna. Una Network engineer de 45 aos que tenga ciertas enfermedades se Teacher, English as a foreign language. Cleora Fleet persona que viva en un hogar de Mining engineer o en un centro de atencin durante mucho tiempo se debe vacunar. Un fumador adulto se Teacher, English as a foreign language. Las personas inmunodeprimidas o con otras enfermedades deben recibir ambas vacunas, PCV13 y PPSV23. Las personas infectadas con el virus de la inmunodeficiencia humana (VIH) deben recibir la vacuna lo antes posible despus del diagnstico. Se debe evitar la vacunacin durante tratamientos de quimioterapia y radioterapia. El uso de rutina de la vacuna PPSV23 no est recomendado para Teacher, early years/pre, personas nativas de [place] o [employer] de 65aos, salvo que tengan ciertas enfermedades que requieran la vacuna. Segn indicacin mdica, las personas que no conozcan su historia de vacunacin y no tengan registros de [place], deben recibir la vacuna PPSV23. Se recomienda una nica revacunacin 5 aos despus de recibir la primera dosis de PPSV23 para personas de 19 a 51 aos con insuficiencia renal crnica, sndrome nefrtico, asplenia o inmunodepresin. Las [place] [employer] recibieron de una a dos dosis de  PPSV23 antes de los 65 aos deben recibir otra dosis de [place] a los 65 aos de edad o posteriormente si pasaron cinco aos, como mnimo, desde la dosis anterior. Las dosis de PPSV23 no son necesarias para personas que ya recibieron la vacuna a los 64 aos o posteriormente.  Vacuna antimeningoccica. Los adultos con asplenia o con persistentes deficiencias de componentes terminales del complemento deben recibir dos dosis de la vacuna antimeningoccica conjugada tetravalente (MenACWY-D). Las dosis se deben Midwife con un mnimo de 2 meses de diferencia. Deben vacunarse los microbilogos que trabajan con ciertas bacterias meningoccicas, reclutas militares y personas que viajan o viven en pases con una alta tasa de meningitis. Los estudiantes universitarios de Tourist information centre manager la edad de 21 que vivan en una residencia estudiantil deben recibir una dosis si no se aplicaron la vacuna cuando cumplieron 16 aos o posteriormente. Las personas que sufren ciertas enfermedades de alto riesgo deben aplicarse una o ms dosis.  Vacuna contra la hepatitis A. Las Advertising copywriter deseen estar protegidas contra esta enfermedad, sufren enfermedad heptica crnica, trabajan con animales infectados con hepatitisA, trabajan en laboratorios de investigacin de hepatitisA, trabajan en pases con una alta tasa de hepatitisA o viajan a estos pases deben recibir la vacuna. Los personas que no fueron vacunadas previamente y [place] a tener un contacto cercano con una persona adoptada fuera del pas, deben recibir la vacuna durante los primeros [address] despus de su llegada a los [place] desde un pas con una alta tasa de hepatitis A.  Vacuna contra la hepatitis B. Las personas adultas deben recibir la vacuna si desean estar protegidas contra esta enfermedad, son menores de 71 aos y tienen diabetes, sufren enfermedad heptica crnica, han tenido ms de [place] pareja sexual en los ltimos 83mses, pueden estar expuestas a  sangre u otros fluidos corporales infecciosos, tienen familiares o parejas sexuales con hepatitisB positivo, son clientes o trabajadores de ciertos centros de atencin, tJournalist, newspaperen pases con una alta tasa de hepatitisB o  viajan a estos pases.  Vacuna antihaemophilus influenzae tipoB (Hib). Una persona no vacunada previamente, que sufra de asplenia o de anemia drepanoctica, o que tenga una esplenectoma programada debe recibir una dosis de la vacuna Hib. Independientemente de la vacunacin previa, un paciente trasplantado con clulas madre hematopoyticas debe recibir Ardelia Mems serie de tres dosis, de 6 a 12 meses despus del trasplante exitoso. La vacuna Hib no est recomendada para personas adultas infectadas con VIH. Controles preventivos - Frecuencia Entre 42 y 13 aos  Control de la presin arterial.**/Cada 3 a 5 aos.  Control de lpidos y colesterol.**Carma Lair cinco aos a partir de los 20 aos.  Anlisis de sangre para la hepatitis C.**/Para toda persona con riesgos conocidos de hepatitis C.  Autoexamen de piel [employer].  Vacuna antigripal. [place] los aos.  Vacuna contra la difteria, ttanos y Advice worker (DTPa, DT).**/Consulte a su mdico. 1dosis de Td cada 10aos.  Vacuna contra la varicela.**/Consulte a su mdico.  Education officer, community. /3 dosis en el curso de 6 meses, si tiene 44 aos o menos.  Vacuna contra el sarampin, la rubola y las paperas ([place]).Marland KitchenEarleen Newport aplicarse al menos una dosis de SRP si ha nacido en 1957 o despus. Podra tambin necesitar una segunda dosis.  Vacuna antineumoccica conjugada 13 valente (PCV13).Marland KitchenCecille Amsterdam a su mdico.  Vacuna antineumoccica de polisacridos (PPSV23).**/De una a dos dosis si es fumador o si sufre Actuary.  Vacuna antimeningoccica.Marland KitchenArdelia Mems dosis si tiene entre 70 y 17 aos y es estudiante universitario de Psychologist, educational ao que vive en una residencia estudiantil o tiene alguna enfermedad grave. Podra tambin  necesitar dosis de refuerzo.  Vacuna contra la hepatitis A.**/Consulte a su mdico.  Vacuna contra la hepatitis B.**/Consulte a su mdico.  Vacuna antihaemophilus influenzae tipoB (Hib).**/Consulte a su mdico. Entre los 27 y 26 aos  Control de la presin arterial.**/Todos los aos.  Control de lpidos y colesterol.**Carma Lair cinco aos a partir de los 20 aos.  Pruebas de deteccin de cncer de pulmn. /Todos los aos si tiene entre 39 y 80aos, y si ha fumado durante 30aos un paquete diario y sigue fumando o dej el hbito en algn momento en los ltimos 15aos. Los estudios de Pharmacologist se interrumpen cuando haya dejado de fumar durante al menos 15aos o si tiene un problema de salud que le impida recibir tratamiento para Science writer de pulmn.  Prueba de Personnel officer en las heces [facility]). /Cada ao comenzando a los 50 aos continuando [facility] 75. No tendr que hacerlo si se realiza una colonoscopa cada 10 aos.  Colonoscopa** o sigmoidoscopa flexible.Marland KitchenCarma Lair 5 aos para la sigmoidoscopa flexible o cada 10 aos para la colonoscopa, a Proofreader de los 13 aos de edad y [place] 45 aos.  Anlisis de sangre para la hepatitis C.**/Para todas las personas nacidas entre 1945 y 1965, y a todo aquel que tenga un riesgo conocido de haber contrado esta enfermedad.  Autoexamen de piel [employer].  Vacuna antigripal. [place] los aos.  Vacuna contra la difteria, ttanos y Advice worker (DTPa/DT).**/Consulte a su mdico. 1dosis de Td cada 10aos.  Vacuna contra la varicela.**Cecille Amsterdam a su mdico.  Vacuna contra el herpes zoster.Marland KitchenArdelia Mems dosis para adultos de 60 aos o ms.  Vacuna contra el sarampin, la rubola y las paperas ([place]).Marland KitchenEarleen Newport aplicarse al menos una dosis de SRP si ha nacido en 1957 o despus. Podra tambin necesitar una segunda dosis.  Vacuna antineumoccica conjugada 13 valente (PCV13).Marland KitchenCecille Amsterdam a su mdico.  Investment banker, operational antineumoccica de polisacridos  (  PPSV23).**/De una a dos dosis si es fumador o si sufre Actuary.  Vacuna antimeningoccica.**Cecille Amsterdam a su mdico.  Investment banker, operational contra la hepatitis A.**/Consulte a su mdico.  Vacuna contra la hepatitis B.**/Consulte a su mdico.  Vacuna antihaemophilus influenzae tipoB (Hib).**/Consulte a su mdico. 65 aos o ms  Control de la presin [employer].  Control de lpidos y colesterol.**Carma Lair cinco aos a partir de los 20 aos.  Pruebas de deteccin de cncer de pulmn. /Todos los aos si tiene entre 82 y 80aos, y si ha fumado durante 30aos un paquete diario y sigue fumando o dej el hbito en algn momento en los ltimos 15aos. Los estudios de Pharmacologist se interrumpen cuando haya dejado de fumar durante al menos 15aos o si tiene un problema de salud que le impida recibir tratamiento para Science writer de pulmn.  Prueba de Personnel officer en las heces [facility]). /Cada ao comenzando a los 50 aos continuando [facility] 75. No tendr que hacerlo si se realiza una colonoscopa cada 10 aos.  Colonoscopa** o sigmoidoscopa flexible.Marland KitchenCarma Lair 5 aos para la sigmoidoscopa flexible o cada 10 aos para la colonoscopa, a Proofreader de los 7 aos de edad y [place] 105 aos.  Anlisis de sangre para la hepatitis C.**/Para todas las personas nacidas entre 1945 y 1965, y a todo aquel que tenga un riesgo conocido de haber contrado esta enfermedad.  Pruebas de deteccin de aneurisma de aorta abdominal (AAA).Marland KitchenArdelia Mems nica prueba para personas entre 78 y 31 aos que sean o hayan sido fumadoras.  Autoexamen de piel [employer].  Vacuna antigripal. [place] los aos.  Vacuna contra la difteria, ttanos y Advice worker (DTPa/DT).**/Una dosis de DT cada 10 aos.  Vacuna contra la varicela.**Cecille Amsterdam a su mdico.  Vacuna contra el herpes zoster.Marland KitchenArdelia Mems dosis para adultos de 60 aos o ms.  Vacuna antineumoccica conjugada 13 valente (PCV13).Marland KitchenArdelia Mems dosis para todos los  adultos de 65 aos o ms.  Vacuna antineumoccica de polisacridos (PPSV23).Marland KitchenArdelia Mems dosis para todos los adultos de 65 aos o ms.  Vacuna antimeningoccica.Marland KitchenCecille Amsterdam a su mdico.  Investment banker, operational contra la hepatitis A.**/Consulte a su mdico.  Vacuna contra la hepatitis B.**/Consulte a su mdico.  Vacuna antihaemophilus influenzae tipoB (Hib).**/Consulte a su mdico. **Los antecedentes familiares y personales de G[place]er y enfermedades pueden Quarry manager las recomendaciones del mdico.   Esta informacin no tiene Marine scientist el consejo del mdico. Asegrese de hacerle al mdico cualquier pregunta que tenga.   Document Released: 02/11/2005 Document Revised: 05/25/2014 Elsevier Interactive Patient Education [employer].

## 2015-03-20 NOTE — Addendum Note (Signed)
Addended by: Tasia Catchings on: 03/20/2015 02:00 PM   Modules accepted: Orders

## 2015-03-20 NOTE — Progress Notes (Signed)
Subjective:    Patient ID: Stephen Vincent, male    DOB: 1950-08-07, 64 y.o.   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 Pt eats healthy. No alcohol. He smokes 2 cigarettes a day. 2 cups a day of coffee.  Pt has no urinary obstructive symptoms.   Pt needs zostavax, tdap, influenza, Needs  psv 23. Counseled with pt and he agreed to 2 today. And will will get psv and tdap in 2-3 weeks.  Pt states he never had colonoscopy. He is willing to get done.  Pt smoking since around 64 years old. Over all about 40 yrs.        Review of Systems  Constitutional: Negative for fever, chills, diaphoresis, activity change and fatigue.  Respiratory: Negative for cough, chest tightness and shortness of breath.   Cardiovascular: Negative for chest pain, palpitations and leg swelling.  Gastrointestinal: Negative for nausea, vomiting and abdominal pain.  Musculoskeletal: Negative for neck pain and neck stiffness.  Neurological: Negative for dizziness, tremors, seizures, syncope, facial asymmetry, speech difficulty, weakness, light-headedness, numbness and headaches.  Psychiatric/Behavioral: Negative for behavioral problems, confusion and agitation. The patient is not nervous/anxious.    Past Medical History  Diagnosis Date  . GERD (gastroesophageal reflux disease)     Social History   Social History  . Marital Status: Widowed    Spouse Name: N/A  . Number of Children: N/A  . Years of Education: N/A   Occupational History  . Not on file.   Social History Main Topics  . Smoking status: Current Every Day Smoker -- 0.10 packs/day for 45 years    Types: Cigarettes, E-cigarettes  . Smokeless tobacco: Never Used  . Alcohol Use: Yes     Comment: rare  . Drug Use: No  . Sexual Activity: Yes   Other Topics Concern  . Not on file   Social History  Narrative    Past Surgical History  Procedure Laterality Date  . Inguinal hernia repair Left 06/12/2014    Procedure: LEFT INGUINAL HERNIA REPAIR WITH MESH;  Surgeon: Jackolyn Confer, MD;  Location: [facility];  Service: General;  Laterality: Left;    Family History  Problem Relation Age of Onset  . Cancer Mother     No Known Allergies  Current Outpatient Prescriptions on File Prior to Visit  Medication Sig Dispense Refill  . ranitidine (ZANTAC) 150 MG tablet Take 150 mg by mouth 2 (two) times daily as needed for heartburn.     No current facility-administered medications on file prior to visit.    BP 116/70 mmHg  Pulse 67  Temp(Src) 97.5 F (36.4 C) (Oral)  Ht 5\' 6"  (1.676 m)  Wt 177 lb (80.287 kg)  BMI 28.58 kg/m2  SpO2 98%    Past Medical History  Diagnosis Date  . GERD (gastroesophageal reflux disease)     Social History   Social History  . Marital Status: Widowed    Spouse Name: N/A  . Number of Children: N/A  . Years of Education: N/A   Occupational History  . Not on file.   Social History Main Topics  . Smoking status: Current Every Day Smoker -- 0.10 packs/day for 45 years    Types: Cigarettes, E-cigarettes  . Smokeless tobacco: Never Used  . Alcohol Use: Yes     Comment: rare  .  Drug Use: No  . Sexual Activity: Yes   Other Topics Concern  . Not on file   Social History Narrative    Past Surgical History  Procedure Laterality Date  . Inguinal hernia repair Left 06/12/2014    Procedure: LEFT INGUINAL HERNIA REPAIR WITH MESH;  Surgeon: Jackolyn Confer, MD;  Location: [facility];  Service: General;  Laterality: Left;    Family History  Problem Relation Age of Onset  . Cancer Mother     No Known Allergies  Current Outpatient Prescriptions on File Prior to Visit  Medication Sig Dispense Refill  . ranitidine (ZANTAC) 150 MG tablet Take 150 mg by mouth 2 (two) times daily as needed for heartburn.     No current facility-administered medications  on file prior to visit.    BP 116/70 mmHg  Pulse 67  Temp(Src) 97.5 F (36.4 C) (Oral)  Ht 5\' 6"  (1.676 m)  Wt 177 lb (80.287 kg)  BMI 28.58 kg/m2  SpO2 98%       Objective:   Physical Exam  General Mental Status- Alert. Orientation- Oriented x3.  Build and Nutrition- Well nourished and Well Developed.  Skin General:-Normal. Color- Normal color. Moisture- Normal. Temperature-Warm.  HEENT  Ears- Normal. Auditory Canal- Bilateral-Normal. Tympanic Membrane- Bilateral-Normal. Eye Fundi-Bilateral-Normal. Pupil- bilateral- Direct reaction to light normal. Nose & Sinuses- Normal. Nostrils-Bilateral- Normal. Mouth & Throat-Normal.  Neck Neck- No Bruits or Masses. Trachea midline.  Thyroid- Normal.  Chest and Lung Exam Percussion: Quality and Intensity-Percussion normal. Percussion of the chest reveals- No Dullness.  Palpation: Palpation of the chest reveals- Non-tender- No dullness. Auscultation: Breath Sounds- Normal.  Adventitous Sounds:-No adventitious sounds.  Cardiovascular Inspection:- No Heaves. Auscultation:-Normal sinus rhythm without murmur gallop, S1 WNL and S2 WNL.  Abdomen Inspection:-Inspection Normal. Inspection of the abdomen reveals- No hernias Palpation/Percussion:- Palpation and Percussion of the Abdomen reveal- Non Tender and No Palpable abdominal masses. Liver: Other Characteristics- No hepatomegaly. Spleen:Other Characteristics- No Splenomegaly. Auscultation:- Auscultation of the abdomen reveals- Bowel sounds normal and No Abdominal bruits.  Male Genitourinary Urethra:- No discharge. Penis- Circumcised. Scrotum- No masses. Testes- Bilateral-Normal.  Rectal Anorectal Exam: Performed- Normal sphincter tone. No masses noted. Prostate smooth normal size. Stool HEME Negative.  Peripheral  Vascular Lower Extremity:Inspection- Bilateral-Inspection Normal    Neurologic Mental Status:- Normal. Cranial Nerves:-Normal  Bilaterally. Motor:-Normal. Strength:5/5 normal muscle strength-All Muscles. . Coordination-Normal. Gait- Normal.  Meningeal Signs- None.  Musculoskeletal Global Assessment General-Joints show full range of motion without obvious deformity and Normal muscle mass. Strength in upper and lower extremities.  Lymphatics General lymphatics Description- No generalized lymphadenopathy.     Assessment & Plan:  rx viagra since he is getting married in November and request the rx. States can get erections most of the time.would like med if needed.

## 2015-03-20 NOTE — Addendum Note (Signed)
Addended by: Tasia Catchings on: 03/20/2015 01:17 PM   Modules accepted: Orders

## 2015-03-20 NOTE — Assessment & Plan Note (Addendum)
Cbc, cmp, tsh, lipid, psa and ua. Will refer for colonoscopy. Also will order ct of chest do to smoking hx.  Pt agreed to zostavaccine and flu vaccine today.

## 2015-03-22 LAB — URINE CULTURE
Colony Count: NO GROWTH
Organism ID, Bacteria: NO GROWTH

## 2015-03-25 ENCOUNTER — Telehealth: Payer: Self-pay | Admitting: Medical

## 2015-03-25 NOTE — Telephone Encounter (Signed)
Spoke with MaryBeth at [facility] and she stated the paperwork has been to the office. Awaiting fax.

## 2015-03-25 NOTE — Telephone Encounter (Signed)
Caller name: Naida Sleight from [employer] for [employer] back number: [phone]   Reason for call:  Requesting orders for CT of the chest please fax #  [phone] [facility]

## 2015-03-27 NOTE — Telephone Encounter (Signed)
Pt did get authorization for [facility] per [facility]. Pt paperwork has been sent for scanning.

## 2015-04-01 ENCOUNTER — Telehealth: Payer: Self-pay

## 2015-04-01 NOTE — Telephone Encounter (Signed)
I called the patient to reschedule his Medicare wellness visit he stated he was suppose to do a urine test at this appointment and believes he did the wellness visit at his CPE . Does this patient need to come in to do a urine test he stated someone called him from our office he also wants to know why he needs to do a CT and Colonoscopy he did the Ct last year and he is not sure why he needs to do it again this year. Please advise

## 2015-04-01 NOTE — Telephone Encounter (Signed)
Spoke with pt and he is willing to give a urine sample to make sure no blood is showing in urine. Pt is willing to continue with CT and Colonoscopy.

## 2015-04-17 ENCOUNTER — Ambulatory Visit: Payer: Managed Care, Other (non HMO)

## 2015-04-19 ENCOUNTER — Ambulatory Visit: Payer: Managed Care, Other (non HMO)

## 2015-04-26 ENCOUNTER — Ambulatory Visit (INDEPENDENT_AMBULATORY_CARE_PROVIDER_SITE_OTHER): Payer: Managed Care, Other (non HMO)

## 2015-04-26 VITALS — BP 128/70 | HR 91 | Ht 66.0 in | Wt 180.6 lb

## 2015-04-26 DIAGNOSIS — Z23 Encounter for immunization: Secondary | ICD-10-CM

## 2015-04-26 DIAGNOSIS — Z0000 Encounter for general adult medical examination without abnormal findings: Secondary | ICD-10-CM

## 2015-04-26 NOTE — Progress Notes (Signed)
Pre visit review using our clinic review tool, if applicable. No additional management support is needed unless otherwise documented below in the visit note. 

## 2015-04-26 NOTE — Patient Instructions (Addendum)
Eat heart healthy diet (full of fruits, vegetables, whole grains, lean protein, water--limit salt, fat, and sugar intake) and increase physical activity as tolerated.  Continue to take steps towards smoking cessation.  Keep appointment with GI for colonoscopy.  Follow up with Mackie Pai, PA-C as needed.     Colonoscopy A colonoscopy is an exam to look at the entire large intestine (colon). This exam can help find problems such as tumors, polyps, inflammation, and areas of bleeding. The exam takes about 1 hour.  LET [facility] CARE PROVIDER KNOW ABOUT:   Any allergies you have.  All medicines you are taking, including vitamins, herbs, eye drops, creams, and over-the-counter medicines.  Previous problems you or members of your family have had with the use of anesthetics.  Any blood disorders you have.  Previous surgeries you have had.  Medical conditions you have. RISKS AND COMPLICATIONS  Generally, this is a safe procedure. However, as with any procedure, complications can occur. Possible complications include:  Bleeding.  Tearing or rupture of the colon wall.  Reaction to medicines given during the exam.  Infection (rare). BEFORE THE PROCEDURE   Ask your health care provider about changing or stopping your regular medicines.  You may be prescribed an oral bowel prep. This involves drinking a large amount of medicated liquid, starting the day before your procedure. The liquid will cause you to have multiple loose stools until your stool is almost clear or light green. This cleans out your colon in preparation for the procedure.  Do not eat or drink anything else once you have started the bowel prep, unless your health care provider tells you it is safe to do so.  Arrange for someone to drive you home after the procedure. PROCEDURE   You will be given medicine to help you relax (sedative).  You will lie on your side with your knees bent.  A long, flexible tube with  a light and camera on the end (colonoscope) will be inserted through the rectum and into the colon. The camera sends video back to a computer screen as it moves through the colon. The colonoscope also releases carbon dioxide gas to inflate the colon. This helps your health care provider see the area better.  During the exam, your health care provider may take a small tissue sample (biopsy) to be examined under a microscope if any abnormalities are found.  The exam is finished when the entire colon has been viewed. AFTER THE PROCEDURE   Do not drive for 24 hours after the exam.  You may have a small amount of blood in your stool.  You may pass moderate amounts of gas and have mild abdominal cramping or bloating. This is caused by the gas used to inflate your colon during the exam.  Ask when your test results will be ready and how you will get your results. Make sure you get your test results.   This information is not intended to replace advice given to you by your health care provider. Make sure you discuss any questions you have with your health care provider.   Document Released: 05/01/2000 Document Revised: 02/22/2013 Document Reviewed: 01/09/2013 Elsevier Interactive Patient Education 2016 [employer].  Smoking Cessation, Tips for Success If you are ready to quit smoking, congratulations! You have chosen to help yourself be healthier. Cigarettes bring nicotine, tar, carbon monoxide, and other irritants into your body. Your lungs, heart, and blood vessels will be able to work better without these poisons. There  are many different ways to quit smoking. Nicotine gum, nicotine patches, a nicotine inhaler, or nicotine nasal spray can help with physical craving. Hypnosis, support groups, and medicines help break the habit of smoking. WHAT THINGS CAN I DO TO MAKE QUITTING EASIER?  Here are some tips to help you quit for good:  Pick a date when you will quit smoking completely. Tell all of  your friends and family about your plan to quit on that date.  Do not try to slowly cut down on the number of cigarettes you are smoking. Pick a quit date and quit smoking completely starting on that day.  Throw away all cigarettes.   Clean and remove all ashtrays from your home, work, and car.  On a card, write down your reasons for quitting. Carry the card with you and read it when you get the urge to smoke.  Cleanse your body of nicotine. Drink enough water and fluids to keep your urine clear or pale yellow. Do this after quitting to flush the nicotine from your body.  Learn to predict your moods. Do not let a bad situation be your excuse to have a cigarette. Some situations in your life might tempt you into wanting a cigarette.  Never have "just one" cigarette. It leads to wanting another and another. Remind yourself of your decision to quit.  Change habits associated with smoking. If you smoked while driving or when feeling stressed, try other activities to replace smoking. Stand up when drinking your coffee. Brush your teeth after eating. Sit in a different chair when you read the paper. Avoid alcohol while trying to quit, and try to drink fewer caffeinated beverages. Alcohol and caffeine may urge you to smoke.  Avoid foods and drinks that can trigger a desire to smoke, such as sugary or spicy foods and alcohol.  Ask people who smoke not to smoke around you.  Have something planned to do right after eating or having a cup of coffee. For example, plan to take a walk or exercise.  Try a relaxation exercise to calm you down and decrease your stress. Remember, you may be tense and nervous for the first 2 weeks after you quit, but this will pass.  Find new activities to keep your hands busy. Play with a pen, coin, or rubber band. Doodle or draw things on paper.  Brush your teeth right after eating. This will help cut down on the craving for the taste of tobacco after meals. You can also  try mouthwash.   Use oral substitutes in place of cigarettes. Try using lemon drops, carrots, cinnamon sticks, or chewing gum. Keep them handy so they are available when you have the urge to smoke.  When you have the urge to smoke, try deep breathing.  Designate your home as a nonsmoking area.  If you are a heavy smoker, ask your health care provider about a prescription for nicotine chewing gum. It can ease your withdrawal from nicotine.  Reward yourself. Set aside the cigarette money you save and buy yourself something nice.  Look for support from others. Join a support group or smoking cessation program. Ask someone at home or at work to help you with your plan to quit smoking.  Always ask yourself, "Do I need this cigarette or is this just a reflex?" Tell yourself, "Today, I choose not to smoke," or "I do not want to smoke." You are reminding yourself of your decision to quit.  Do not replace cigarette smoking  with electronic cigarettes (commonly called e-cigarettes). The safety of e-cigarettes is unknown, and some may contain harmful chemicals.  If you relapse, do not give up! Plan ahead and think about what you will do the next time you get the urge to smoke. HOW WILL I FEEL WHEN I QUIT SMOKING? You may have symptoms of withdrawal because your body is used to nicotine (the addictive substance in cigarettes). You may crave cigarettes, be irritable, feel very hungry, cough often, get headaches, or have difficulty concentrating. The withdrawal symptoms are only temporary. They are strongest when you first quit but will go away within 10-14 days. When withdrawal symptoms occur, stay in control. Think about your reasons for quitting. Remind yourself that these are signs that your body is healing and getting used to being without cigarettes. Remember that withdrawal symptoms are easier to treat than the major diseases that smoking can cause.  Even after the withdrawal is over, expect periodic  urges to smoke. However, these cravings are generally short lived and will go away whether you smoke or not. Do not smoke! WHAT RESOURCES ARE AVAILABLE TO HELP ME QUIT SMOKING? Your health care provider can direct you to community resources or hospitals for support, which may include:  Group support.  Education.  Hypnosis.  Therapy.   This information is not intended to replace advice given to you by your health care provider. Make sure you discuss any questions you have with your health care provider.   Document Released: 01/31/2004 Document Revised: 05/25/2014 Document Reviewed: 10/20/2012 Elsevier Interactive Patient Education 2016 [place] Can Quit Smoking If you are ready to quit smoking or are thinking about it, congratulations! You have chosen to help yourself be healthier and live longer! There are lots of different ways to quit smoking. Nicotine gum, nicotine patches, a nicotine inhaler, or nicotine nasal spray can help with physical craving. Hypnosis, support groups, and medicines help break the habit of smoking. TIPS TO GET OFF AND STAY OFF CIGARETTES  Learn to predict your moods. Do not let a bad situation be your excuse to have a cigarette. Some situations in your life might tempt you to have a cigarette.  Ask friends and co-workers not to smoke around you.  Make your home smoke-free.  Never have "just one" cigarette. It leads to wanting another and another. Remind yourself of your decision to quit.  On a card, make a list of your reasons for not smoking. Read it at least the same number of times a day as you have a cigarette. Tell yourself everyday, "I do not want to smoke. I choose not to smoke."  Ask someone at home or work to help you with your plan to quit smoking.  Have something planned after you eat or have a cup of coffee. Take a walk or get other exercise to perk you up. This will help to keep you from overeating.  Try a relaxation exercise to calm  you down and decrease your stress. Remember, you may be tense and nervous the first two weeks after you quit. This will pass.  Find new activities to keep your hands busy. Play with a pen, coin, or rubber band. Doodle or draw things on paper.  Brush your teeth right after eating. This will help cut down the craving for the taste of tobacco after meals. You can try mouthwash too.  Try gum, breath mints, or diet candy to keep something in your mouth. IF YOU SMOKE AND WANT TO  QUIT:  Do not stock up on cigarettes. Never buy a carton. Wait until one pack is finished before you buy another.  Never carry cigarettes with you at work or at home.  Keep cigarettes as far away from you as possible. Leave them with someone else.  Never carry matches or a lighter with you.  Ask yourself, "Do I need this cigarette or is this just a reflex?"  Bet with someone that you can quit. Put cigarette money in a piggy bank every morning. If you smoke, you give up the money. If you do not smoke, by the end of the week, you keep the money.  Keep trying. It takes 21 days to change a habit!  Talk to your doctor about using medicines to help you quit. These include nicotine replacement gum, lozenges, or skin patches.   This information is not intended to replace advice given to you by your health care provider. Make sure you discuss any questions you have with your health care provider.   Document Released: 02/28/2009 Document Revised: 07/27/2011 Document Reviewed: 02/28/2009 Elsevier Interactive Patient Education [employer].

## 2015-04-26 NOTE — Progress Notes (Addendum)
Subjective:   Stephen Vincent is a 64 y.o. male who presents for an Initial Medicare Annual Wellness Visit.  Review of Systems:  No ROS Cardiac Risk Factors include: advanced age (>49men, >36 women);male gender;smoking/ tobacco exposure Sleep patterns:  Sleeps 6-7 hours per night/ususally does not wake up during hte night to void Home Safety/Smoke Alarms:  Feels safe home.  Lives alone 1 level home.  Smoke alarms.   Firearm Safety:  No firearms.   Seat Belt Safety/Bike Helmet:  Always wears seat belt.    Counseling:   Eye Exam- Sept 2016 per patient  Dental-  Goes every 6 months.   Male:  CCS-Referral has been placed.     PSA- 03/20/15- 0.57    Pneumonia 23 given today    Objective:    Today's Vitals   04/26/15 1301  BP: 128/70  Pulse: 91  Height: 5\' 6"  (1.676 m)  Weight: 180 lb 9.6 oz (81.92 kg)  SpO2: 97%  PainSc: 0-No pain    Current Medications (verified) Outpatient Encounter Prescriptions as of 04/26/2015  Medication Sig  . ranitidine (ZANTAC) 150 MG tablet Take 150 mg by mouth 2 (two) times daily as needed for heartburn.  . sildenafil (VIAGRA) 50 MG tablet Take 1 tablet (50 mg total) by mouth daily as needed for erectile dysfunction. (Patient not taking: Reported on 04/26/2015)   No facility-administered encounter medications on file as of 04/26/2015.    Allergies (verified) Review of patient's allergies indicates no known allergies.   History: Past Medical History  Diagnosis Date  . GERD (gastroesophageal reflux disease)    Past Surgical History  Procedure Laterality Date  . Inguinal hernia repair Left 06/12/2014    Procedure: LEFT INGUINAL HERNIA REPAIR WITH MESH;  Surgeon: Jackolyn Confer, MD;  Location: [place];  Service: General;  Laterality: Left;   Family History  Problem Relation Age of Onset  . Cancer Mother    Social History   Occupational History  . Not on file.   Social History Main Topics  . Smoking status: Current Every Day Smoker -- 0.10  packs/day for 45 years    Types: Cigarettes, E-cigarettes  . Smokeless tobacco: Never Used     Comment: Uses smokeless cigarette   . Alcohol Use: 0.0 oz/week    0 Standard drinks or equivalent per week     Comment: rarely  . Drug Use: No  . Sexual Activity: Yes   Tobacco Counseling Ready to quit: Yes Counseling given: Yes   Activities of Daily Living In your present state of health, do you have any difficulty performing the following activities: 04/26/2015 06/05/2014  Hearing? N N  Vision? N N  Difficulty concentrating or making decisions? N N  Walking or climbing stairs? N N  Dressing or bathing? N N  Doing errands, shopping? N N  Preparing Food and eating ? N -  Using the Toilet? N -  In the past six months, have you accidently leaked urine? N -  Do you have problems with loss of bowel control? N -  Managing your Medications? N -  Managing your Finances? N -  Housekeeping or managing your Housekeeping? N -    Immunizations and Health Maintenance Immunization History  Administered Date(s) Administered  . Influenza,inj,Quad PF,36+ Mos 03/20/2015  . Pneumococcal Conjugate-13 03/07/2014  . Pneumococcal Polysaccharide-23 04/26/2015  . Zoster 03/20/2015   Health Maintenance Due  Topic Date Due  . COLONOSCOPY  11/27/1998  . PNA vac Low Risk Adult (2 of  2 - PPSV23) 03/08/2015    Patient Care Team: Mackie Pai, PA-C as PCP - General (Physician Assistant)  Pt states he does not see any other doctors.    Indicate any recent Medical Services you may have received from other than [facility] providers in the past year (date may be approximate).    Assessment:   This is a routine wellness examination for [name].   Hearing/Vision screen  Hearing Screening   125Hz  250Hz  500Hz  1000Hz  2000Hz  4000Hz  8000Hz   Right ear:   Pass Pass Pass Fail   Left ear:   Pass Pass Pass Fail   Comments: Decrease hearing in left ear.    Vision Screening Comments: No changes in vision. Last eye  exam:  3 months ago---[employer] Wears corrective glasses  Dietary issues and exercise activities discussed: Current Exercise Habits:: The patient has a physically strenous job, but has no regular exercise apart from work.   Diet:  Eats 3 meals per day.  Regular diet.  Drinks lot of water.    Goals    . Quit smoking / using tobacco by next year      Depression Screen PHQ 2/9 Scores 04/26/2015 03/20/2015 02/01/2014  PHQ - 2 Score 0 0 0    Fall Risk Fall Risk  03/20/2015 02/01/2014  Falls in the past year? No No  Risk for fall due to : - History of fall(s)    Cognitive Function: AD8 screening completed.  Score 0/8.    Screening Tests Health Maintenance  Topic Date Due  . COLONOSCOPY  11/27/1998  . PNA vac Low Risk Adult (2 of 2 - PPSV23) 03/08/2015  . TETANUS/TDAP  03/19/2016 (Originally 11/27/1969)  . Hepatitis C Screening  03/19/2016 (Originally [birth year])  . INFLUENZA VACCINE  12/17/2015  . ZOSTAVAX  Completed       Plan:  Eat heart healthy diet (full of fruits, vegetables, whole grains, lean protein, water--limit salt, fat, and sugar intake) and increase physical activity as tolerated.  Continue to take steps towards smoking cessation.  Keep appointment with GI for colonoscopy.  Follow up with Mackie Pai, PA-C as needed.      During the course of the visit Demareon was educated and counseled about the following appropriate screening and preventive services:   Vaccines to include Pneumoccal, Influenza, Hepatitis B, Td, Zostavax, HCV  Electrocardiogram  Colorectal cancer screening  Cardiovascular disease screening  Diabetes screening  Glaucoma screening  Nutrition counseling  Prostate cancer screening  Smoking cessation counseling  Patient Instructions (the written plan) were given to the patient.   Rudene Anda, RN   04/26/2015       I have reviewed RN evaluation and plan. Note last had colonoscopy per above in 2000. 03-2015 I did put in  referral for him to be scheduled for colonscopy.

## 2015-05-21 ENCOUNTER — Telehealth: Payer: Self-pay | Admitting: Medical

## 2015-05-21 DIAGNOSIS — R911 Solitary pulmonary nodule: Secondary | ICD-10-CM

## 2015-05-21 DIAGNOSIS — Z87891 Personal history of nicotine dependence: Secondary | ICD-10-CM

## 2015-05-21 NOTE — Telephone Encounter (Signed)
Pt has history of small lung nodule and hx of smoking. He recommend repeating the ct of chest at one year. He is a little over due by about 2 months. Would you call him and advise him that I am putting in order. Would you call radiology and see if they can get him in within 1-2 wks.

## 2015-05-21 NOTE — Telephone Encounter (Signed)
Ct order pelvis came up on studies not done. I ordered this after [place] was inconclusive. But later pt had surgery for inguinal hernia in this region. So study does not appear warranted and I will "done" lab.

## 2015-05-22 NOTE — Telephone Encounter (Signed)
I put in ct of chest. No contrast order last night. In the past I considered ct pevlis for other problem after [place] was not conclusive. Later he was found to have hernia. So order I put in last night should be ct chest?

## 2015-05-22 NOTE — Telephone Encounter (Signed)
I do not see where a CT Pelvis was ordered. Patient did have CT of Chest on 03/21/15. Are you requesting another CT of Chest? That order was placed on 05/21/15 or are you requesting a CT Pelvis? Please advise

## 2015-06-10 ENCOUNTER — Encounter: Payer: Managed Care, Other (non HMO) | Admitting: Internal Medicine

## 2015-06-24 ENCOUNTER — Telehealth: Payer: Self-pay | Admitting: Medical

## 2015-06-24 NOTE — Telephone Encounter (Signed)
Pt is calling about order for CT and colonoscopy. He is stating that he can get them done at a different imaging center to save money. He said his insurance gave him a couple different places, one being in [place], that he can go to. He is asking if we can send them an order or how to go about changing the order. Call back [phone].

## 2015-06-24 NOTE — Telephone Encounter (Signed)
Spoke with pt and he will call back with all the information of the clinics that he would like to be seen at and give me a call.

## 2015-07-23 ENCOUNTER — Encounter: Payer: Self-pay | Admitting: Internal Medicine

## 2015-07-31 ENCOUNTER — Other Ambulatory Visit (HOSPITAL_BASED_OUTPATIENT_CLINIC_OR_DEPARTMENT_OTHER): Payer: Managed Care, Other (non HMO)

## 2015-08-02 ENCOUNTER — Ambulatory Visit (HOSPITAL_BASED_OUTPATIENT_CLINIC_OR_DEPARTMENT_OTHER)
Admission: RE | Admit: 2015-08-02 | Discharge: 2015-08-02 | Disposition: A | Discharge status: Home / Self Care | Payer: Managed Care, Other (non HMO) | Source: Ambulatory Visit | Attending: Medical | Admitting: Medical

## 2015-08-02 DIAGNOSIS — Z720 Tobacco use: Secondary | ICD-10-CM | POA: Insufficient documentation

## 2015-08-02 DIAGNOSIS — R911 Solitary pulmonary nodule: Secondary | ICD-10-CM

## 2015-08-02 DIAGNOSIS — R918 Other nonspecific abnormal finding of lung field: Secondary | ICD-10-CM | POA: Insufficient documentation

## 2015-08-02 DIAGNOSIS — Z87891 Personal history of nicotine dependence: Secondary | ICD-10-CM

## 2015-08-04 ENCOUNTER — Telehealth: Payer: Self-pay | Admitting: Medical

## 2015-08-05 ENCOUNTER — Telehealth: Payer: Self-pay | Admitting: Medical

## 2015-08-05 NOTE — Telephone Encounter (Signed)
Reviewed. Not sent to office staff regarding his ct chest results.

## 2015-08-05 NOTE — Telephone Encounter (Signed)
Spoke with pt's son Criselda Peaches and he understood the information and will inform father.

## 2015-08-05 NOTE — Telephone Encounter (Signed)
Caller name: Katha Cabal Relationship to patient: Son Can be reached: [phone]   Reason for call: Son request call back about patients test results

## 2015-08-05 NOTE — Telephone Encounter (Signed)
Spoke with Pt's son and he was advised of the results and he voices understanding.

## 2015-08-06 NOTE — Telephone Encounter (Signed)
Pt son on [facility]

## 2015-09-18 ENCOUNTER — Ambulatory Visit (AMBULATORY_SURGERY_CENTER): Payer: Self-pay

## 2015-09-18 VITALS — Ht 66.0 in | Wt 187.0 lb

## 2015-09-18 DIAGNOSIS — Z1211 Encounter for screening for malignant neoplasm of colon: Secondary | ICD-10-CM

## 2015-09-18 MED ORDER — SUPREP BOWEL PREP KIT 17.5-3.13-1.6 GM/177ML PO SOLN: 1.0000 | Freq: Once | ORAL | Status: DC

## 2015-09-18 NOTE — Progress Notes (Signed)
No allergies to eggs or soy No past problems with anesthesia No diet meds No home oxygen  Has email and internet; registered emmi

## 2015-09-23 ENCOUNTER — Encounter: Payer: Self-pay | Admitting: Internal Medicine

## 2015-10-01 ENCOUNTER — Telehealth: Payer: Self-pay | Admitting: *Deleted

## 2015-10-01 NOTE — Telephone Encounter (Signed)
Pt called in and spoke with Bethena Roys about having a taxi company bring him to his procedure and take him home, when Rutledge checked to verify that this was ok the pt hung up, Royston Cowper called up to recovery with pt on line, pt thought he was speaking with Juliann Pulse but there is no Juliann Pulse here today, Pamala Hurry transferred pt call to recovery, pt wants to know if we have a transportation service to and from the facility, advised pt we do not, then pt wanted to know what the number for a cab company was, gave the pt several numbers to local cab companies-adm

## 2015-10-02 ENCOUNTER — Ambulatory Visit (AMBULATORY_SURGERY_CENTER): Payer: Managed Care, Other (non HMO) | Admitting: Internal Medicine

## 2015-10-02 ENCOUNTER — Encounter: Payer: Self-pay | Admitting: Internal Medicine

## 2015-10-02 ENCOUNTER — Telehealth: Payer: Self-pay | Admitting: Medical

## 2015-10-02 VITALS — BP 115/91 | HR 64 | Temp 99.1°F | Resp 13 | Ht 66.0 in | Wt 187.0 lb

## 2015-10-02 DIAGNOSIS — D120 Benign neoplasm of cecum: Secondary | ICD-10-CM

## 2015-10-02 DIAGNOSIS — K635 Polyp of colon: Secondary | ICD-10-CM

## 2015-10-02 DIAGNOSIS — D123 Benign neoplasm of transverse colon: Secondary | ICD-10-CM | POA: Diagnosis not present

## 2015-10-02 DIAGNOSIS — Z1211 Encounter for screening for malignant neoplasm of colon: Secondary | ICD-10-CM | POA: Diagnosis not present

## 2015-10-02 MED ORDER — SODIUM CHLORIDE 0.9 % IV SOLN: 500.0000 mL | INTRAVENOUS | Status: DC

## 2015-10-02 NOTE — Telephone Encounter (Signed)
The colonoscopy will be put in chart when the report is completed in health maintenance.

## 2015-10-02 NOTE — Telephone Encounter (Signed)
Will you abstract colonoscopy. Done by [place] but health maintenance not listed??

## 2015-10-02 NOTE — Patient Instructions (Signed)
Spanish discharge instructions given and spanish handouts given on hemorrhoids and polyps.   Result letter in your mail in 2-3 weeks. Resume current medications. Thank you!!   YOU HAD AN ENDOSCOPIC PROCEDURE TODAY AT [facility]:   Refer to the procedure report that was given to you for any specific questions about what was found during the examination.  If the procedure report does not answer your questions, please call your gastroenterologist to clarify.  If you requested that your care partner not be given the details of your procedure findings, then the procedure report has been included in a sealed envelope for you to review at your convenience later.  YOU SHOULD EXPECT: Some feelings of bloating in the abdomen. Passage of more gas than usual.  Walking can help get rid of the air that was put into your GI tract during the procedure and reduce the bloating. If you had a lower endoscopy (such as a colonoscopy or flexible sigmoidoscopy) you may notice spotting of blood in your stool or on the toilet paper. If you underwent a bowel prep for your procedure, you may not have a normal bowel movement for a few days.  Please Note:  You might notice some irritation and congestion in your nose or some drainage.  This is from the oxygen used during your procedure.  There is no need for concern and it should clear up in a day or so.  SYMPTOMS TO REPORT IMMEDIATELY:   Following lower endoscopy (colonoscopy or flexible sigmoidoscopy):  Excessive amounts of blood in the stool  Significant tenderness or worsening of abdominal pains  Swelling of the abdomen that is new, acute  Fever of 100F or higher   For urgent or emergent issues, a gastroenterologist can be reached at any hour by calling [phone].   DIET: Your first meal following the procedure should be a small meal and then it is ok to progress to your normal diet. Heavy or fried foods are harder to digest and may make you  feel nauseous or bloated.  Likewise, meals heavy in dairy and vegetables can increase bloating.  Drink plenty of fluids but you should avoid alcoholic beverages for 24 hours.  ACTIVITY:  You should plan to take it easy for the rest of today and you should NOT DRIVE or use heavy machinery until tomorrow (because of the sedation medicines used during the test).    FOLLOW UP: Our staff will call the number listed on your records the next business day following your procedure to check on you and address any questions or concerns that you may have regarding the information given to you following your procedure. If we do not reach you, we will leave a message.  However, if you are feeling well and you are not experiencing any problems, there is no need to return our call.  We will assume that you have returned to your regular daily activities without incident.  If any biopsies were taken you will be contacted by phone or by letter within the next 1-3 weeks.  Please call us at [phone] if you have not heard about the biopsies in 3 weeks.    SIGNATURES/CONFIDENTIALITY: You and/or your care partner have signed paperwork which will be entered into your electronic medical record.  These signatures attest to the fact that that the information above on your After Visit Summary has been reviewed and is understood.  Full responsibility of the confidentiality of this discharge information lies with  you and/or your care-partner.

## 2015-10-02 NOTE — Progress Notes (Signed)
Called to room to assist during endoscopic procedure.  Patient ID and intended procedure confirmed with present staff. Received instructions for my participation in the procedure from the performing physician.  

## 2015-10-02 NOTE — Op Note (Signed)
[place] Patient Name: Stephen Vincent Procedure Date: 10/02/2015 8:54 AM MRN: [id number] Endoscopist: Jerene Bears , MD Age: 64 Referring MD:  Date of Birth: 09-May-1951 Gender: Male Procedure:                Colonoscopy Indications:              Screening for colorectal malignant neoplasm, This                            is the patient's first colonoscopy Medicines:                Monitored Anesthesia Care Procedure:                Pre-Anesthesia Assessment:                           - Prior to the procedure, a History and Physical                            was performed, and patient medications and                            allergies were reviewed. The patient's tolerance of                            previous anesthesia was also reviewed. The risks                            and benefits of the procedure and the sedation                            options and risks were discussed with the patient.                            All questions were answered, and informed consent                            was obtained. Prior Anticoagulants: The patient has                            taken no previous anticoagulant or antiplatelet                            agents. ASA Grade Assessment: II - A patient with                            mild systemic disease. After reviewing the risks                            and benefits, the patient was deemed in                            satisfactory condition to undergo the procedure.  After obtaining informed consent, the colonoscope                            was passed under direct vision. Throughout the                            procedure, the patient's blood pressure, pulse, and                            oxygen saturations were monitored continuously. The                            Model PCF-[id number]) scope was introduced                            through the anus and advanced to the the cecum,                            identified by appendiceal orifice and ileocecal                            valve. The colonoscopy was performed without                            difficulty. The patient tolerated the procedure                            well. The quality of the bowel preparation was                            good. The ileocecal valve, appendiceal orifice, and                            rectum were photographed. Scope In: 9:01:32 AM Scope Out: 9:21:44 AM Scope Withdrawal Time: 0 hours 17 minutes 14 seconds  Total Procedure Duration: 0 hours 20 minutes 12 seconds  Findings:                 Two sessile polyps were found in the cecum. The                            polyps were 3 to 4 mm in size. These polyps were                            removed with a cold snare. Resection and retrieval                            were complete.                           Two sessile polyps were found in the hepatic                            flexure. The polyps were 4 to 5 mm in  size. These                            polyps were removed with a cold snare. Resection                            and retrieval were complete.                           Internal hemorrhoids were found during                            retroflexion. The hemorrhoids were small. Complications:            No immediate complications. Estimated Blood Loss:     Estimated blood loss: none. Impression:               - Two 3 to 4 mm polyps in the cecum, removed with a                            cold snare. Resected and retrieved.                           - Two 4 to 5 mm polyps at the hepatic flexure,                            removed with a cold snare. Resected and retrieved.                           - Internal hemorrhoids. Recommendation:           - Patient has a contact number available for                            emergencies. The signs and symptoms of potential                            delayed complications were  discussed with the                            patient. Return to normal activities tomorrow.                            Written discharge instructions were provided to the                            patient.                           - Resume previous diet.                           - Continue present medications.                           - Await pathology results.                           -  Repeat colonoscopy date to be determined after                            pending pathology results are reviewed for                            surveillance based on pathology results. Jerene Bears, MD 10/02/2015 9:25:31 AM This report has been signed electronically.

## 2015-10-02 NOTE — Progress Notes (Signed)
Report to PACU, RN, vss, BBS= Clear.  

## 2015-10-03 ENCOUNTER — Telehealth: Payer: Self-pay

## 2015-10-03 NOTE — Telephone Encounter (Signed)
The colonoscopy was updated in health maintenance on 10/02/15.

## 2015-10-03 NOTE — Telephone Encounter (Signed)
Left message on answering machine. 

## 2015-10-07 ENCOUNTER — Encounter: Payer: Self-pay | Admitting: Internal Medicine

## 2017-01-11 ENCOUNTER — Encounter: Payer: Self-pay | Admitting: Medical

## 2017-01-11 ENCOUNTER — Ambulatory Visit (INDEPENDENT_AMBULATORY_CARE_PROVIDER_SITE_OTHER): Payer: Managed Care, Other (non HMO) | Admitting: Medical

## 2017-01-11 ENCOUNTER — Telehealth: Payer: Self-pay | Admitting: Medical

## 2017-01-11 VITALS — BP 140/80 | HR 75 | Temp 98.3°F | Resp 16 | Ht 66.0 in | Wt 186.0 lb

## 2017-01-11 DIAGNOSIS — Z125 Encounter for screening for malignant neoplasm of prostate: Secondary | ICD-10-CM | POA: Diagnosis not present

## 2017-01-11 DIAGNOSIS — Z0000 Encounter for general adult medical examination without abnormal findings: Secondary | ICD-10-CM | POA: Diagnosis not present

## 2017-01-11 DIAGNOSIS — Z23 Encounter for immunization: Secondary | ICD-10-CM

## 2017-01-11 DIAGNOSIS — R319 Hematuria, unspecified: Secondary | ICD-10-CM

## 2017-01-11 LAB — LIPID PANEL
CHOL/HDL RATIO: 3.3 ratio (ref <5.0)
CHOLESTEROL: 186 mg/dL (ref <200)
HDL: 56 mg/dL (ref >40)
LDL Cholesterol: 103 mg/dL — ABNORMAL HIGH (ref <100)
Triglycerides: 133 mg/dL (ref <150)
VLDL: 27 mg/dL (ref <30)

## 2017-01-11 LAB — CBC WITH DIFFERENTIAL/PLATELET
Basophils Absolute: 0 10*3/uL (ref 0.0–0.1)
Basophils Relative: 0.6 % (ref 0.0–3.0)
EOS ABS: 0.5 10*3/uL (ref 0.0–0.7)
EOS PCT: 6.3 % — AB (ref 0.0–5.0)
HCT: 46.7 % (ref 39.0–52.0)
Hemoglobin: 15.6 g/dL (ref 13.0–17.0)
LYMPHS ABS: 2.1 10*3/uL (ref 0.7–4.0)
Lymphocytes Relative: 29 % (ref 12.0–46.0)
MCHC: 33.4 g/dL (ref 30.0–36.0)
MCV: 88 fl (ref 78.0–100.0)
MONO ABS: 0.5 10*3/uL (ref 0.1–1.0)
MONOS PCT: 6.7 % (ref 3.0–12.0)
NEUTROS PCT: 57.4 % (ref 43.0–77.0)
Neutro Abs: 4.2 10*3/uL (ref 1.4–7.7)
Platelets: 197 10*3/uL (ref 150.0–400.0)
RBC: 5.31 Mil/uL (ref 4.22–5.81)
RDW: 14.1 % (ref 11.5–15.5)
WBC: 7.3 10*3/uL (ref 4.0–10.5)

## 2017-01-11 LAB — POC URINALSYSI DIPSTICK (AUTOMATED)
BILIRUBIN UA: NEGATIVE
GLUCOSE UA: NEGATIVE
KETONES UA: NEGATIVE
Leukocytes, UA: NEGATIVE
Nitrite, UA: NEGATIVE
Protein, UA: NEGATIVE
Urobilinogen, UA: 0.2 E.U./dL
pH, UA: 6 (ref 5.0–8.0)

## 2017-01-11 LAB — PSA: PSA: 0.64 ng/mL (ref 0.10–4.00)

## 2017-01-11 LAB — COMPREHENSIVE METABOLIC PANEL
ALT: 15 U/L (ref 0–53)
AST: 14 U/L (ref 0–37)
Albumin: 4.4 g/dL (ref 3.5–5.2)
Alkaline Phosphatase: 86 U/L (ref 39–117)
BUN: 15 mg/dL (ref 6–23)
CHLORIDE: 105 meq/L (ref 96–112)
CO2: 29 mEq/L (ref 19–32)
Calcium: 9.3 mg/dL (ref 8.4–10.5)
Creatinine, Ser: 0.91 mg/dL (ref 0.40–1.50)
GFR: 88.03 mL/min (ref >60.00)
GLUCOSE: 88 mg/dL (ref 70–99)
POTASSIUM: 3.8 meq/L (ref 3.5–5.1)
SODIUM: 138 meq/L (ref 135–145)
Total Bilirubin: 0.8 mg/dL (ref 0.2–1.2)
Total Protein: 6.9 g/dL (ref 6.0–8.3)

## 2017-01-11 NOTE — Patient Instructions (Addendum)
For you wellness exam today I have ordered cbc, cmp, lipid panel, psa and ua.  Vaccine given today tdap  Recommend exercise and healthy diet.  We will let you know lab results as they come in.  Flu vaccine by mid October recommended.  Ask insurance if they cover hep c antibody testing. Will you do if you request or if they cover. Just let us know.  Follow up date appointment will be determined after lab review.    Preventive Care 68 Years and Older, Male Preventive care refers to lifestyle choices and visits with your health care provider that can promote health and wellness. What does preventive care include?  A yearly physical exam. This is also called an annual well check.  Dental exams once or twice a year.  Routine eye exams. Ask your health care provider how often you should have your eyes checked.  Personal lifestyle choices, including: ? Daily care of your teeth and gums. ? Regular physical activity. ? Eating a healthy diet. ? Avoiding tobacco and drug use. ? Limiting alcohol use. ? Practicing safe sex. ? Taking low doses of aspirin every day. ? Taking vitamin and mineral supplements as recommended by your health care provider. What happens during an annual well check? The services and screenings done by your health care provider during your annual well check will depend on your age, overall health, lifestyle risk factors, and family history of disease. Counseling Your health care provider may ask you questions about your:  Alcohol use.  Tobacco use.  Drug use.  Emotional well-being.  Home and relationship well-being.  Sexual activity.  Eating habits.  History of falls.  Memory and ability to understand (cognition).  Work and work Statistician.  Screening You may have the following tests or measurements:  Height, weight, and BMI.  Blood pressure.  Lipid and cholesterol levels. These may be checked every 5 years, or more frequently if you are  over 35 years old.  Skin check.  Lung cancer screening. You may have this screening every year starting at age 79 if you have a 30-pack-year history of smoking and currently smoke or have quit within the past 15 years.  Fecal occult blood test (FOBT) of the stool. You may have this test every year starting at age 87.  Flexible sigmoidoscopy or colonoscopy. You may have a sigmoidoscopy every 5 years or a colonoscopy every 10 years starting at age 78.  Prostate cancer screening. Recommendations will vary depending on your family history and other risks.  Hepatitis C blood test.  Hepatitis B blood test.  Sexually transmitted disease (STD) testing.  Diabetes screening. This is done by checking your blood sugar (glucose) after you have not eaten for a while (fasting). You may have this done every 1-3 years.  Abdominal aortic aneurysm (AAA) screening. You may need this if you are a current or former smoker.  Osteoporosis. You may be screened starting at age 36 if you are at high risk.  Talk with your health care provider about your test results, treatment options, and if necessary, the need for more tests. Vaccines Your health care provider may recommend certain vaccines, such as:  Influenza vaccine. This is recommended every year.  Tetanus, diphtheria, and acellular pertussis (Tdap, Td) vaccine. You may need a Td booster every 10 years.  Varicella vaccine. You may need this if you have not been vaccinated.  Zoster vaccine. You may need this after age 60.  Measles, mumps, and rubella (MMR) vaccine. You  may need at least one dose of MMR if you were born in 1957 or later. You may also need a second dose.  Pneumococcal 13-valent conjugate (PCV13) vaccine. One dose is recommended after age 52.  Pneumococcal polysaccharide (PPSV23) vaccine. One dose is recommended after age 85.  Meningococcal vaccine. You may need this if you have certain conditions.  Hepatitis A vaccine. You may  need this if you have certain conditions or if you travel or work in places where you may be exposed to hepatitis A.  Hepatitis B vaccine. You may need this if you have certain conditions or if you travel or work in places where you may be exposed to hepatitis B.  Haemophilus influenzae type b (Hib) vaccine. You may need this if you have certain risk factors.  Talk to your health care provider about which screenings and vaccines you need and how often you need them. This information is not intended to replace advice given to you by your health care provider. Make sure you discuss any questions you have with your health care provider. Document Released: 05/31/2015 Document Revised: 01/22/2016 Document Reviewed: 03/05/2015 Elsevier Interactive Patient Education  2017 [employer].

## 2017-01-11 NOTE — Telephone Encounter (Signed)
Please see referral to urologist. Patient is Spanish speaker so coordinate referral information/notify patient with Stephen Vincent.

## 2017-01-11 NOTE — Progress Notes (Signed)
Subjective:    Patient ID: Stephen Vincent, male    DOB: [birth year], 66 y.o.   MRN: [id number]  HPI  Pt in for follow up/physical.  Pt bp mild elevated today. No hx of MI or cardiovascular event.  Pt has mild high sugar in past. Pt exercises/walks a lot at work.   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 Pt eats healthy. No alcohol. He smokes 2 cigarettes a day. 2 cups a day of coffee.  Pt had colonoscopy in 2017. Told to repeat in 5 years. Told small polyp found.  Tdap per epic he needs. He states will get today.  Pt declines hep C antibody test.  No urine flow issues. No obstruction.  Some slight swollen dip joint swelling in past 2 years.   Review of Systems  Constitutional: Negative for chills, fatigue and fever.  Respiratory: Negative for chest tightness, shortness of breath, wheezing and stridor.   Cardiovascular: Negative for chest pain and palpitations.  Gastrointestinal: Negative for abdominal distention, abdominal pain, blood in stool, constipation and diarrhea.  Genitourinary: Negative for decreased urine volume, difficulty urinating, flank pain, genital sores, hematuria, penile pain, penile swelling, scrotal swelling and testicular pain.  Musculoskeletal:       Rt knee pain recently. He got injection and now pain is resolved.  Skin: Negative for pallor and rash.  Hematological: Negative for adenopathy. Does not bruise/bleed easily.  Psychiatric/Behavioral: Negative for behavioral problems and confusion.      Past Medical History:  Diagnosis Date  . GERD (gastroesophageal reflux disease)      Social History   Social History  . Marital status: Widowed    Spouse name: N/A  . Number of children: N/A  . Years of education: N/A   Occupational History  . Not on file.   Social History Main Topics  . Smoking status: Current Every Day Smoker    Packs/day: 0.10    Years: 45.00    Types: Cigarettes, E-cigarettes    . Smokeless tobacco: Never Used     Comment: Uses smokeless cigarette   . Alcohol use 0.0 oz/week     Comment: rarely  . Drug use: No  . Sexual activity: Yes   Other Topics Concern  . Not on file   Social History Narrative  . No narrative on file    Past Surgical History:  Procedure Laterality Date  . INGUINAL HERNIA REPAIR Left 06/12/2014   Procedure: LEFT INGUINAL HERNIA REPAIR WITH MESH;  Surgeon: Jackolyn Confer, MD;  Location: [facility] [facility];  Service: General;  Laterality: Left;    Family History  Problem Relation Age of Onset  . Cancer Mother   . Colon cancer Neg Hx     No Known Allergies  Current Outpatient Prescriptions on File Prior to Visit  Medication Sig Dispense Refill  . ranitidine (ZANTAC) 150 MG tablet Take 150 mg by mouth 2 (two) times daily as needed for heartburn.    . sildenafil (VIAGRA) 50 MG tablet Take 1 tablet (50 mg total) by mouth daily as needed for erectile dysfunction. 10 tablet 0   No current facility-administered medications on file prior to visit.     BP 140/80   Pulse 75   Temp 98.3 F (36.8 C) (Oral)   Resp 16   Ht 5\' 6"  (1.676 m)   Wt 186 lb (84.4 kg)   SpO2 99%   BMI 30.02 kg/m  Objective:   Physical Exam  General Mental Status- Alert. General Appearance- Not in acute distress.   Skin General: Color- Normal Color. Moisture- Normal Moisture.  Neck Carotid Arteries- Normal color. Moisture- Normal Moisture. No carotid bruits. No JVD.  Chest and Lung Exam Auscultation: Breath Sounds:-Normal.  Cardiovascular Auscultation:Rythm- Regular. Murmurs & Other Heart Sounds:Auscultation of the heart reveals- No Murmurs.  Abdomen Inspection:-Inspeection Normal. Palpation/Percussion:Note:No mass. Palpation and Percussion of the abdomen reveal- Non Tender, Non Distended + BS, no rebound or guarding.  Neurologic Cranial Nerve exam:- CN III-XII intact(No nystagmus), symmetric smile. Drift Test:- No drift. Finger to Nose:-  Normal/Intact  Strength:- 5/5 equal and symmetric strength both upper and lower extremities.  Genital exam- no hernias on exam.   Rectal- tight sphincter on exam. Smooth prostate. No nodules.  Hands- some mild swelling to dip joints on both hands.      Assessment & Plan:  For you wellness exam today I have ordered cbc, cmp, lipid panel, psa and ua.  Vaccine given today tdap  Recommend exercise and healthy diet.  We will let you know lab results as they come in.  Flu vaccine by mid October recommended.  Ask insurance if they cover hep c antibody testing. Will you do if you request or if they cover. Just let us know.  Follow up date appointment will be determined after lab review.   Later may do arthritis panel studies but not doing today on wellness exam    Stephen Vincent, Stephen Vincent, [place]

## 2017-01-12 LAB — URINE CULTURE: ORGANISM ID, BACTERIA: NO GROWTH

## 2017-01-13 ENCOUNTER — Telehealth: Payer: Self-pay | Admitting: Medical

## 2017-01-13 NOTE — Telephone Encounter (Signed)
Called pt and LVM for pt to return call.

## 2017-01-13 NOTE — Telephone Encounter (Signed)
Spoke with pt and informed him the below (pt was inform will be called by Referral Coordinator for his URO appt) Pt understood, pt did have a question wanted to know by provider what can cause the blood to be in his urine. Please advise.

## 2017-01-13 NOTE — Telephone Encounter (Signed)
-----   Message from Mackie Pai, PA-C sent at 01/12/2017  3:44 PM EDT ----- Patient's urine culture showed no growth. Will your mind Anderson Malta to get him scheduled with the urologist regarding blood in his urine. Thanks also asked Anderson Malta to please update me when that appointment is.

## 2017-12-03 IMAGING — CT CT CHEST W/O CM
2 of 3 series · 15 of 36 positions shown, 18 images · non-contrast
Comparison: 03/20/2014

CLINICAL DATA: Followup pulmonary nodule.

EXAM:
CT CHEST WITHOUT CONTRAST
TECHNIQUE: Multidetector CT imaging of the chest was performed following the
standard protocol without IV contrast.

[Series 2: thorax · axial · 0.76mm/px · z∈[-300,-30]mm · 12 of 64 slices shown, 15 images]
[im 5/64  mediastinal]
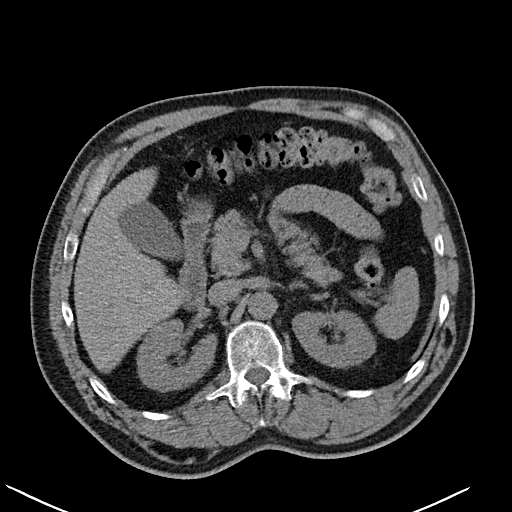
[im 5/64  lung]
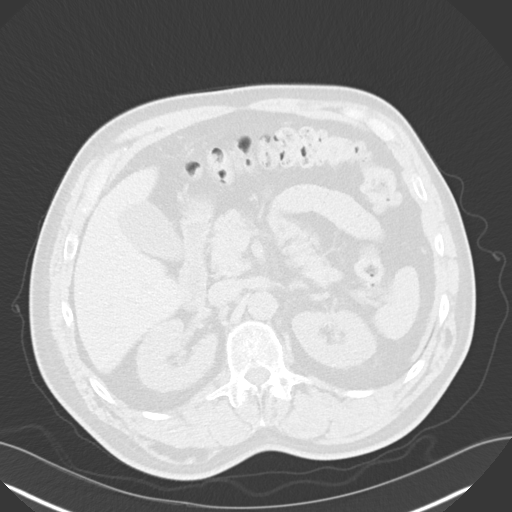
[im 10/64  lung]
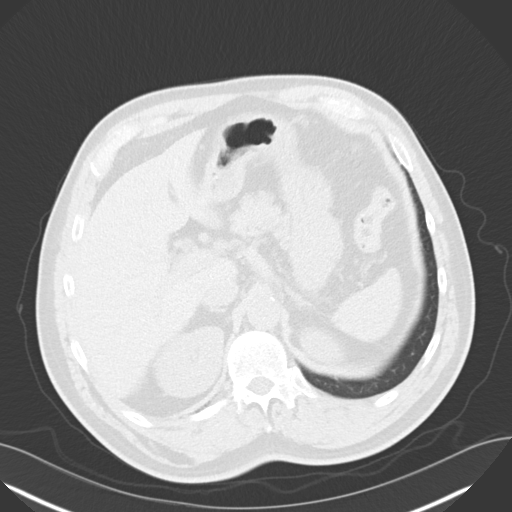
[im 15/64  lung]
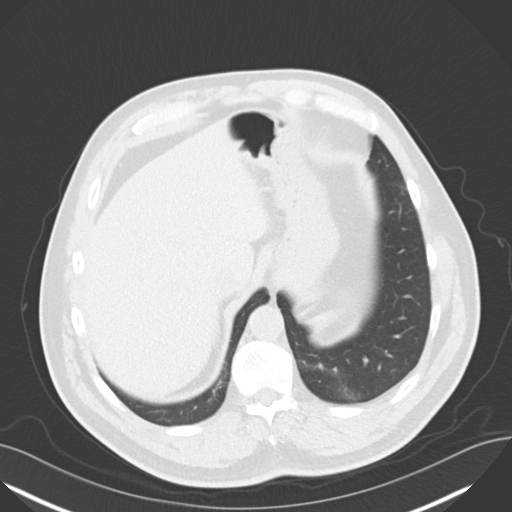
[im 19/64  lung]
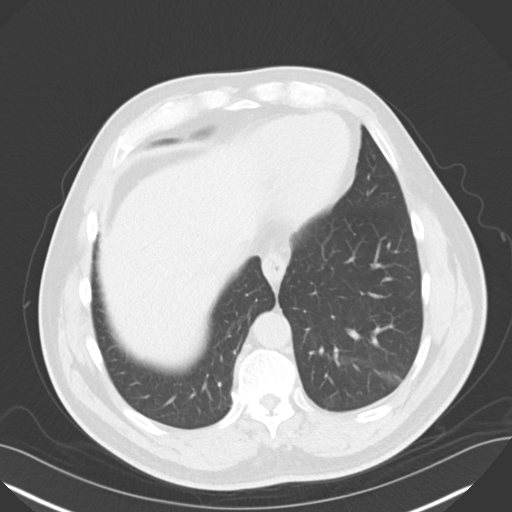
[im 24/64  mediastinal]
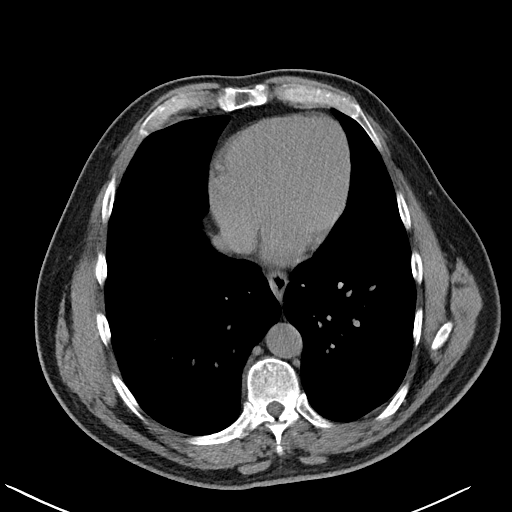
[im 24/64  lung]
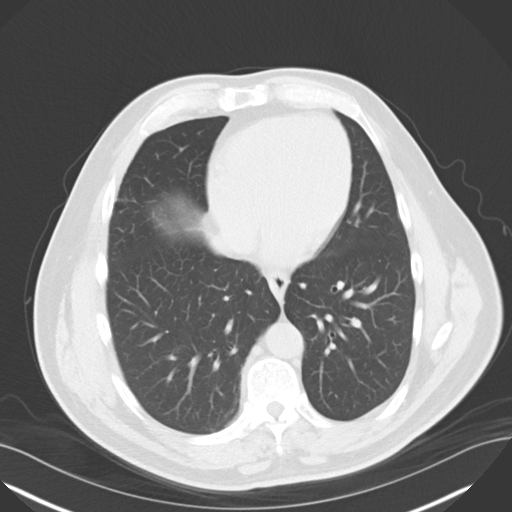
[im 29/64  lung]
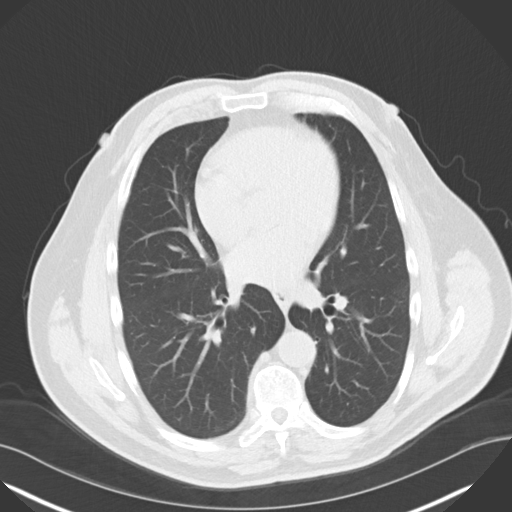
[im 36/64  lung]
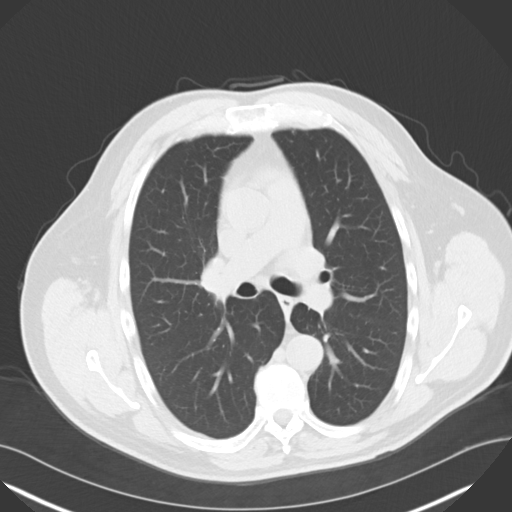
[im 40/64  lung]
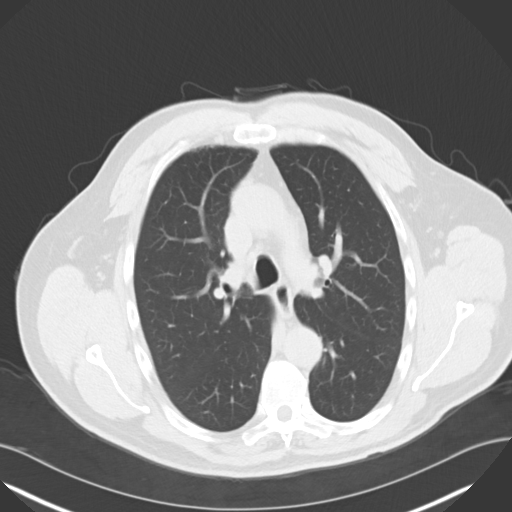
[im 45/64  mediastinal]
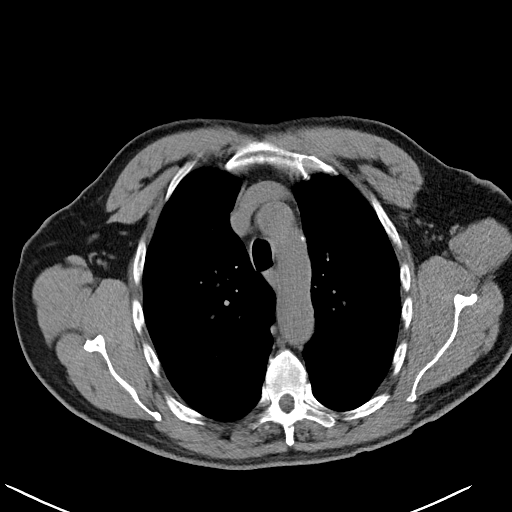
[im 45/64  lung]
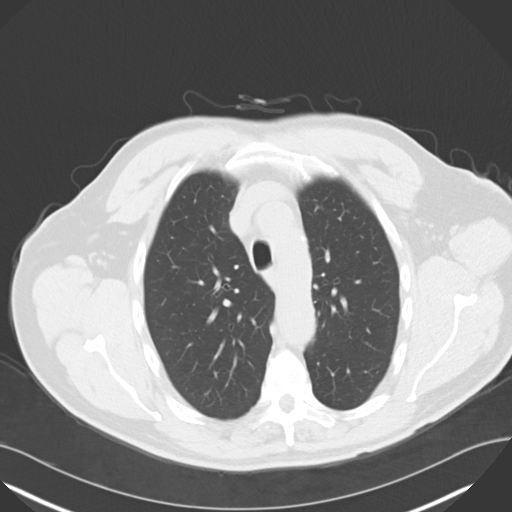
[im 50/64  lung]
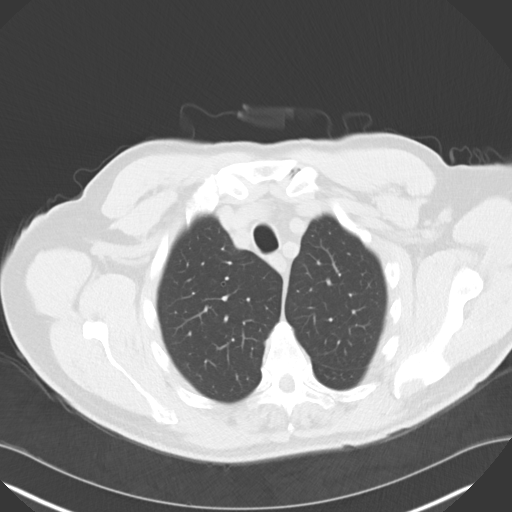
[im 54/64  lung]
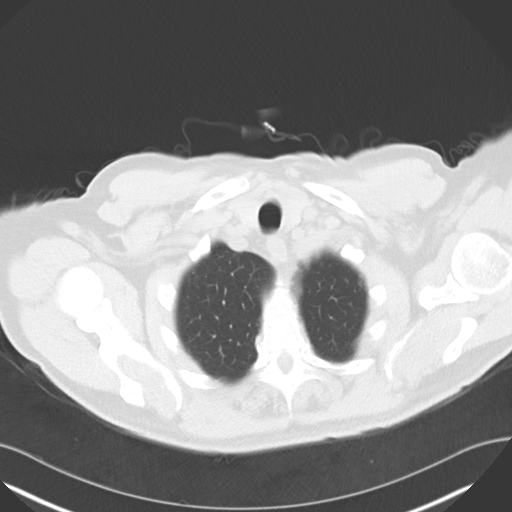
[im 59/64  lung]
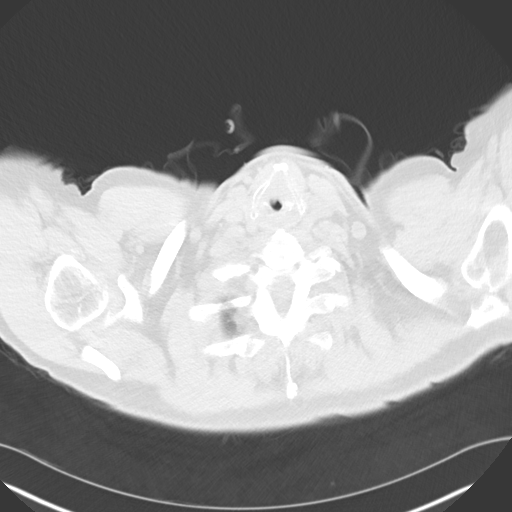

[Series 5: coronal · coronal · 0.65mm/px · 3 of 148 slices shown]
[im 30/148  lung]
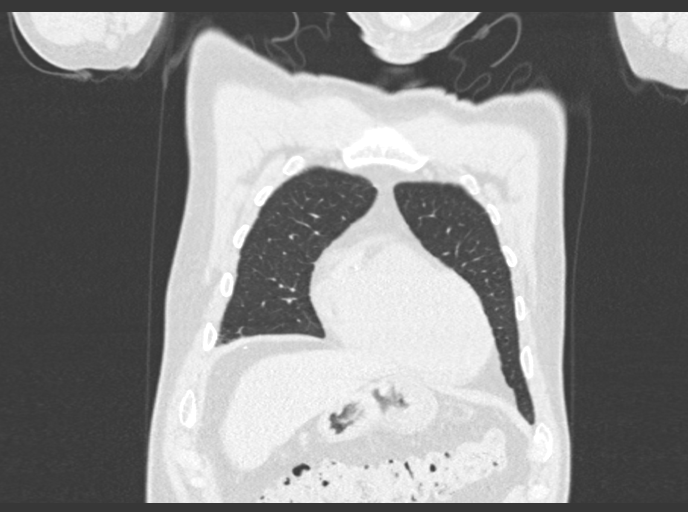
[im 59/148  lung]
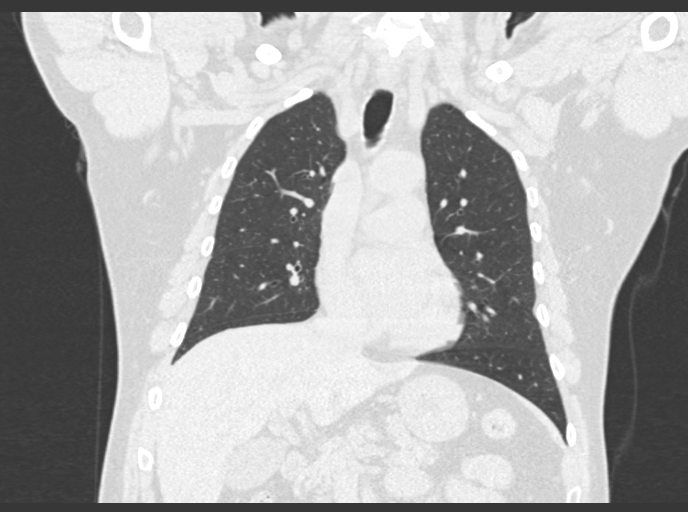
[im 89/148  lung]
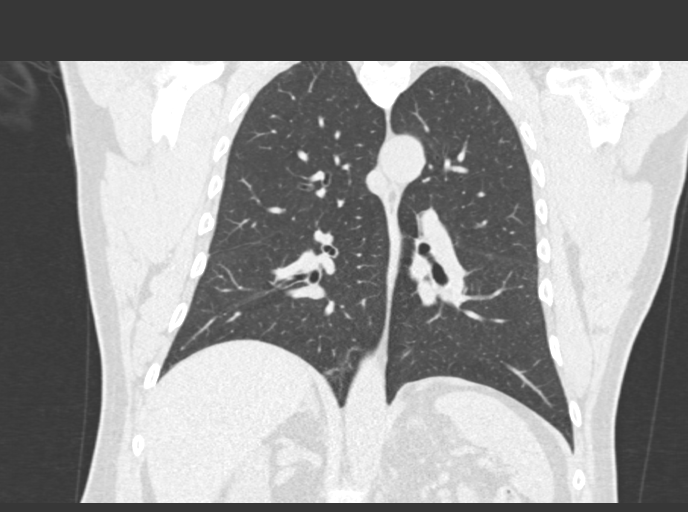

[15 of 36 positions shown; findings below may reference images not displayed]

FINDINGS: Mediastinum/Nodes: No chest wall mass, supraclavicular or axillary
lymphadenopathy. The thyroid gland appears normal.

The heart is normal in size. No pericardial effusion. The aorta is
normal in caliber. Scattered atherosclerotic calcifications at the
arch.

No mediastinal or hilar mass or adenopathy. The esophagus is grossly
normal.

Lungs/Pleura: 2 small sub 3mm nodules are noted in the right upper
lobe near the minor fissure. There stability over 2 years is
consistent with a benign process.

Upper abdomen: No significant findings.

Musculoskeletal: No significant findings. Remote healed sternal
fractures noted.
IMPRESSION: 1. Two small sub 3 mm right upper lobe pulmonary nodules since 9681,
considered benign.
2. No new pulmonary lesions or acute pulmonary findings.
3. No mediastinal or hilar mass or adenopathy.
4. No significant upper abdominal findings.

## 2021-01-06 ENCOUNTER — Encounter: Payer: Self-pay | Admitting: Internal Medicine
